# Patient Record
Sex: Female | Born: 1995 | Race: Black or African American | Hispanic: No | Marital: Single | Smoking: Never smoker
Health system: Southern US, Community
[De-identification: ages and names within clinical notes are randomized; demographics above are authoritative.]

## PROBLEM LIST (undated history)

## (undated) DIAGNOSIS — F329 Major depressive disorder, single episode, unspecified: Secondary | ICD-10-CM

## (undated) DIAGNOSIS — F32A Depression, unspecified: Secondary | ICD-10-CM

## (undated) DIAGNOSIS — F419 Anxiety disorder, unspecified: Secondary | ICD-10-CM

## (undated) DIAGNOSIS — F909 Attention-deficit hyperactivity disorder, unspecified type: Secondary | ICD-10-CM

## (undated) HISTORY — DX: Major depressive disorder, single episode, unspecified: F32.9

## (undated) HISTORY — DX: Anxiety disorder, unspecified: F41.9

## (undated) HISTORY — DX: Depression, unspecified: F32.A

## (undated) HISTORY — DX: Attention-deficit hyperactivity disorder, unspecified type: F90.9

---
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
## 2017-02-01 ENCOUNTER — Ambulatory Visit (HOSPITAL_COMMUNITY): Payer: No Typology Code available for payment source | Admitting: Psychiatry

## 2017-02-18 ENCOUNTER — Other Ambulatory Visit (HOSPITAL_COMMUNITY): Payer: Self-pay | Admitting: Psychiatry

## 2017-02-24 ENCOUNTER — Other Ambulatory Visit (HOSPITAL_COMMUNITY): Payer: Self-pay

## 2017-02-24 MED ORDER — ESCITALOPRAM OXALATE 10 MG PO TABS: 10.0000 mg | ORAL_TABLET | Freq: Every day | ORAL | 0 refills | Status: DC

## 2017-02-24 MED ORDER — BUPROPION HCL ER (XL) 150 MG PO TB24: 150.0000 mg | ORAL_TABLET | Freq: Every day | ORAL | 0 refills | Status: DC

## 2017-02-24 NOTE — Telephone Encounter (Signed)
Sent in a one month supply for wellbutrin and Lexapro. Informed pharmacy that patient needs to call our office to make an appointment. Nothing further is needed at this time.

## 2017-03-12 ENCOUNTER — Emergency Department (HOSPITAL_COMMUNITY)
Admission: EM | Admit: 2017-03-12 | Discharge: 2017-03-12 | Disposition: A | Discharge status: Home / Self Care | Payer: BLUE CROSS/BLUE SHIELD | Attending: Emergency Medicine | Admitting: Emergency Medicine

## 2017-03-12 ENCOUNTER — Emergency Department (HOSPITAL_COMMUNITY): Payer: BLUE CROSS/BLUE SHIELD

## 2017-03-12 ENCOUNTER — Encounter (HOSPITAL_COMMUNITY): Payer: Self-pay | Admitting: Emergency Medicine

## 2017-03-12 ENCOUNTER — Other Ambulatory Visit: Payer: Self-pay

## 2017-03-12 DIAGNOSIS — R112 Nausea with vomiting, unspecified: Secondary | ICD-10-CM | POA: Diagnosis not present

## 2017-03-12 DIAGNOSIS — R109 Unspecified abdominal pain: Secondary | ICD-10-CM | POA: Diagnosis present

## 2017-03-12 DIAGNOSIS — R197 Diarrhea, unspecified: Secondary | ICD-10-CM | POA: Diagnosis not present

## 2017-03-12 DIAGNOSIS — K861 Other chronic pancreatitis: Secondary | ICD-10-CM | POA: Insufficient documentation

## 2017-03-12 DIAGNOSIS — Z79899 Other long term (current) drug therapy: Secondary | ICD-10-CM | POA: Insufficient documentation

## 2017-03-12 DIAGNOSIS — K858 Other acute pancreatitis without necrosis or infection: Secondary | ICD-10-CM | POA: Diagnosis not present

## 2017-03-12 LAB — COMPREHENSIVE METABOLIC PANEL
ALBUMIN: 3.6 g/dL (ref 3.5–5.0)
ALK PHOS: 76 U/L (ref 38–126)
ALT: 20 U/L (ref 14–54)
ANION GAP: 7 (ref 5–15)
AST: 23 U/L (ref 15–41)
BILIRUBIN TOTAL: 0.6 mg/dL (ref 0.3–1.2)
BUN: 8 mg/dL (ref 6–20)
CALCIUM: 8.8 mg/dL — AB (ref 8.9–10.3)
CO2: 23 mmol/L (ref 22–32)
Chloride: 107 mmol/L (ref 101–111)
Creatinine, Ser: 0.77 mg/dL (ref 0.44–1.00)
GFR calc non Af Amer: >60 mL/min (ref >60)
GLUCOSE: 94 mg/dL (ref 65–99)
POTASSIUM: 3.9 mmol/L (ref 3.5–5.1)
Sodium: 137 mmol/L (ref 135–145)
TOTAL PROTEIN: 7.6 g/dL (ref 6.5–8.1)

## 2017-03-12 LAB — CBC
HEMATOCRIT: 39.3 % (ref 36.0–46.0)
HEMOGLOBIN: 12.9 g/dL (ref 12.0–15.0)
MCH: 28.7 pg (ref 26.0–34.0)
MCHC: 32.8 g/dL (ref 30.0–36.0)
MCV: 87.5 fL (ref 78.0–100.0)
Platelets: 279 10*3/uL (ref 150–400)
RBC: 4.49 MIL/uL (ref 3.87–5.11)
RDW: 12.8 % (ref 11.5–15.5)
WBC: 8.3 10*3/uL (ref 4.0–10.5)

## 2017-03-12 LAB — I-STAT BETA HCG BLOOD, ED (MC, WL, AP ONLY)

## 2017-03-12 LAB — LIPASE, BLOOD: Lipase: 121 U/L — ABNORMAL HIGH (ref 11–51)

## 2017-03-12 MED ORDER — IOPAMIDOL (ISOVUE-300) INJECTION 61%
INTRAVENOUS | Status: AC
  Filled 2017-03-12: qty 100

## 2017-03-12 MED ORDER — ONDANSETRON HCL 4 MG/2ML IJ SOLN
4.0000 mg | Freq: Once | INTRAMUSCULAR | Status: AC
  Administered 2017-03-12: 4 mg via INTRAVENOUS
  Filled 2017-03-12: qty 2

## 2017-03-12 MED ORDER — PERCOCET 5-325 MG PO TABS: 1.0000 | ORAL_TABLET | Freq: Four times a day (QID) | ORAL | 0 refills | Status: DC | PRN

## 2017-03-12 MED ORDER — MORPHINE SULFATE (PF) 4 MG/ML IV SOLN
4.0000 mg | Freq: Once | INTRAVENOUS | Status: AC
  Administered 2017-03-12: 4 mg via INTRAVENOUS
  Filled 2017-03-12: qty 1

## 2017-03-12 MED ORDER — PROMETHAZINE HCL 25 MG PO TABS: 25.0000 mg | ORAL_TABLET | Freq: Four times a day (QID) | ORAL | 0 refills | Status: DC | PRN

## 2017-03-12 MED ORDER — SODIUM CHLORIDE 0.9 % IV BOLUS (SEPSIS)
1000.0000 mL | Freq: Once | INTRAVENOUS | Status: AC
  Administered 2017-03-12: 1000 mL via INTRAVENOUS

## 2017-03-12 NOTE — Discharge Instructions (Signed)
Return here as needed.  Follow-up with your primary doctor.  Follow a clear liquid diet over the next 48 hours

## 2017-03-12 NOTE — Progress Notes (Signed)
Patient was alert when Arrived. Patient able to talk about her health challenges. Father on-site and very calm. Chaplain provided emotional support and com[passionate presence.

## 2017-03-12 NOTE — ED Notes (Signed)
Pt verbalized understanding of d/c instructions and has no further questions. Pt to follow up with pcp and GI. Removed all belongings from room.

## 2017-03-12 NOTE — ED Notes (Signed)
Pt returned to TRA C from radiology

## 2017-03-12 NOTE — ED Triage Notes (Signed)
Pt. Stated, I started having severe stomach pain this morning and throwing up bile cause nothing on my stomach.

## 2017-03-12 NOTE — ED Notes (Signed)
Pt taken to CT.

## 2017-03-13 NOTE — ED Provider Notes (Signed)
[facility] EMERGENCY [facility]PART[facility] Provider Note   CSN: [id number] Arrival date & time: 03/12/17  1054     History   Chief Complaint Chief Complaint  Patient presents with  . Abdominal Pain  . Pancreatitis    HPI Shannon Holloway is a 21 y.o. female.  HPI Patient presents to the emergency department with nausea vomiting with diarrhea and also abdominal discomfort.  Patient states that she has chronic pancreatitis.  She states that she sees a GI doctor in [place] but has no one here to follow-up with.  The patient states that nothing seems to make the condition better.  She states palpation makes the pain worse.  Patient states she did not take any medications prior to arrival patient states that she still has nausea but has not vomited in several hours.  The patient denies chest pain, shortness of breath, headache,blurred vision, neck pain, fever, cough, weakness, numbness, dizziness, anorexia, edema, rash, back pain, dysuria, hematemesis, bloody stool, near syncope, or syncope. Past Medical History:  Diagnosis Date  . ADHD (attention deficit hyperactivity disorder)   . Anxiety   . Depression     Patient Active Problem List   Diagnosis Date Noted  . History of panic attacks 05/19/2016  . Major depressive disorder 05/19/2016    Past Surgical History:  Procedure Laterality Date  . CHOLECYSTECTOMY  2012  . HERNIA REPAIR  2001    OB History    No data available       Home Medications    Prior to Admission medications   Medication Sig Start Date End Date Taking? Authorizing Provider  buPROPion (WELLBUTRIN XL) 150 MG 24 hr tablet Take 1 tablet (150 mg total) by mouth daily. 02/24/17  Yes Thresa RossAkhtar, Nadeem, MD  escitalopram (LEXAPRO) 10 MG tablet Take 1 tablet (10 mg total) by mouth daily. Take one a day 02/24/17  Yes Thresa RossAkhtar, Nadeem, MD  JUNEL 1/20 1-20 MG-MCG tablet Take 1 tablet by mouth daily. 08/08/16  Yes [provider]  VYVANSE 20 MG  capsule Take 1 capsule (20 mg total) by mouth every morning. 11/23/16  Yes Thresa RossAkhtar, Nadeem, MD  escitalopram (LEXAPRO) 10 MG tablet TAKE 1 TABLET BY MOUTH ONCE DAILY Patient not taking: Reported on 03/12/2017 02/24/17   Thresa RossAkhtar, Nadeem, MD  PERCOCET 5-325 MG tablet Take 1 tablet by mouth every 6 (six) hours as needed for severe pain. 03/12/17   Yehudit Fulginiti, Cristal Deerhristopher, PA-C  promethazine (PHENERGAN) 25 MG tablet Take 1 tablet (25 mg total) by mouth every 6 (six) hours as needed for nausea or vomiting. 03/12/17   Hermila Millis, Cristal Deerhristopher, PA-C  VYVANSE 20 MG capsule Take 1 capsule (20 mg total) by mouth every morning. Patient not taking: Reported on 03/12/2017 11/23/16   Thresa RossAkhtar, Nadeem, MD    Family History Family History  Problem Relation Age of Onset  . Anxiety disorder Mother   . Anxiety disorder Maternal Grandmother     Social History Social History   Tobacco Use  . Smoking status: Never Smoker  . Smokeless tobacco: Never Used  Substance Use Topics  . Alcohol use: No  . Drug use: No     Allergies   Patient has no known allergies.   Review of Systems Review of Systems All other systems negative except as documented in the HPI. All pertinent positives and negatives as reviewed in the HPI.  Physical Exam Updated Vital Signs BP 128/83   Pulse 68   Temp 98.3 F (36.8 C) (Oral)   Resp  16   Ht 5\' 7"  (1.702 m)   Wt 73 kg (161 lb)   LMP 02/20/2017   SpO2 100%   BMI 25.22 kg/m   Physical Exam  Constitutional: She is oriented to person, place, and time. She appears well-developed and well-nourished. No distress.  HENT:  Head: Normocephalic and atraumatic.  Mouth/Throat: Oropharynx is clear and moist.  Eyes: Pupils are equal, round, and reactive to light.  Neck: Normal range of motion. Neck supple.  Cardiovascular: Normal rate, regular rhythm and normal heart sounds. Exam reveals no gallop and no friction rub.  No murmur heard. Pulmonary/Chest: Effort normal and breath sounds  normal. No respiratory distress. She has no wheezes.  Abdominal: Soft. Bowel sounds are normal. She exhibits no distension. There is generalized tenderness. There is no rigidity, no rebound, no guarding and no tenderness at McBurney's point. No hernia.  Neurological: She is alert and oriented to person, place, and time. She exhibits normal muscle tone. Coordination normal.  Skin: Skin is warm and dry. Capillary refill takes less than 2 seconds. No rash noted. No erythema.  Psychiatric: She has a normal mood and affect. Her behavior is normal.  Nursing note and vitals reviewed.    ED Treatments / Results  Labs (all labs ordered are listed, but only abnormal results are displayed) Labs Reviewed  LIPASE, BLOOD - Abnormal; Notable for the following components:      Result Value   Lipase 121 (*)    All other components within normal limits  COMPREHENSIVE METABOLIC PANEL - Abnormal; Notable for the following components:   Calcium 8.8 (*)    All other components within normal limits  CBC  I-STAT BETA HCG BLOOD, ED ([facility], [facility], [facility] ONLY)    EKG  EKG Interpretation None       Radiology Ct Abdomen Pelvis Wo Contrast  Result Date: 03/12/2017 CLINICAL DATA:  Abdominal distention.  Chronic pancreatitis EXAM: CT ABDOMEN AND PELVIS WITHOUT CONTRAST TECHNIQUE: Multidetector CT imaging of the abdomen and pelvis was performed following the standard protocol without IV contrast. COMPARISON:  None. FINDINGS: Lower chest: Lung bases are clear. No effusions. Heart is normal size. Hepatobiliary: No focal liver abnormality is seen. Status post cholecystectomy. No biliary dilatation. Pancreas: Marked enlargement of the pancreatic head with indistinctness of the margins and surrounding stranding. Findings presumably represent acute pancreatitis. Recommend follow-up after resolution of acute symptoms to exclude pancreatic head mass. Spleen: No focal abnormality.  Normal size. Adrenals/Urinary Tract: No adrenal  abnormality. No focal renal abnormality. No stones or hydronephrosis. Urinary bladder is unremarkable. Stomach/Bowel: Stomach, large and small bowel grossly unremarkable. Appendix not visualized. No inflammatory process in the right lower quadrant. Vascular/Lymphatic: No evidence of aneurysm or adenopathy. Reproductive: Uterus and adnexa unremarkable.  No mass. Other: No free fluid or free air. Musculoskeletal: No acute bony abnormality. IMPRESSION: Marked enlargement and indistinctness of the pancreatic head with surrounding inflammation/stranding most likely or reflecting acute pancreatitis. Recommend follow-up CT with IV contrast after acute symptoms resolve to exclude pancreatic head mass. Electronically Signed   By: Charlett NoseKevin  Dover M.D.   On: 03/12/2017 16:09    Procedures Procedures (including critical care time)  Medications Ordered in ED Medications  morphine 4 MG/ML injection 4 mg (4 mg Intravenous Given 03/12/17 1427)  sodium chloride 0.9 % bolus 1,000 mL (0 mLs Intravenous Stopped 03/12/17 1548)  ondansetron (ZOFRAN) injection 4 mg (4 mg Intravenous Given 03/12/17 1427)     Initial Impression / Assessment and Plan / ED Course  I have reviewed the triage vital signs and the nursing notes.  Pertinent labs & imaging results that were available during my care of the patient were reviewed by me and considered in my medical decision making (see chart for details).     Patient has a pancreatitis which is chronic for her.  She does have some CT scan findings that showed the inflammation of the pancreas.  Patient is advised to follow a clear liquid diet.  Told return here for any worsening in her condition will have her follow-up with GI.  Patient is feeling better at this time following IV fluids and pain medications.  Patient has had no vomiting here in the emergency department  Final Clinical Impressions(s) / ED Diagnoses   Final diagnoses:  Other acute pancreatitis without infection or  necrosis    ED Discharge Orders        Ordered    PERCOCET 5-325 MG tablet  Every 6 hours PRN     03/12/17 1641    promethazine (PHENERGAN) 25 MG tablet  Every 6 hours PRN     03/12/17 1641       Charlestine Night, PA-C 03/17/17 0034    Charlestine Night, PA-C 03/17/17 0034    Tilden Fossa, MD 03/23/17 1428

## 2017-05-06 ENCOUNTER — Other Ambulatory Visit (HOSPITAL_COMMUNITY): Payer: Self-pay | Admitting: Psychiatry

## 2017-05-07 ENCOUNTER — Other Ambulatory Visit (HOSPITAL_COMMUNITY): Payer: Self-pay | Admitting: Psychiatry

## 2017-06-18 ENCOUNTER — Other Ambulatory Visit (HOSPITAL_COMMUNITY): Payer: Self-pay | Admitting: Psychiatry

## 2017-12-17 IMAGING — CR DG CHEST 2V
2 series · 2 of 2 positions shown · non-contrast
Comparison: None.

CLINICAL DATA: Chronic cough with fever

EXAM:
CHEST  2 VIEW

[w chest pa]
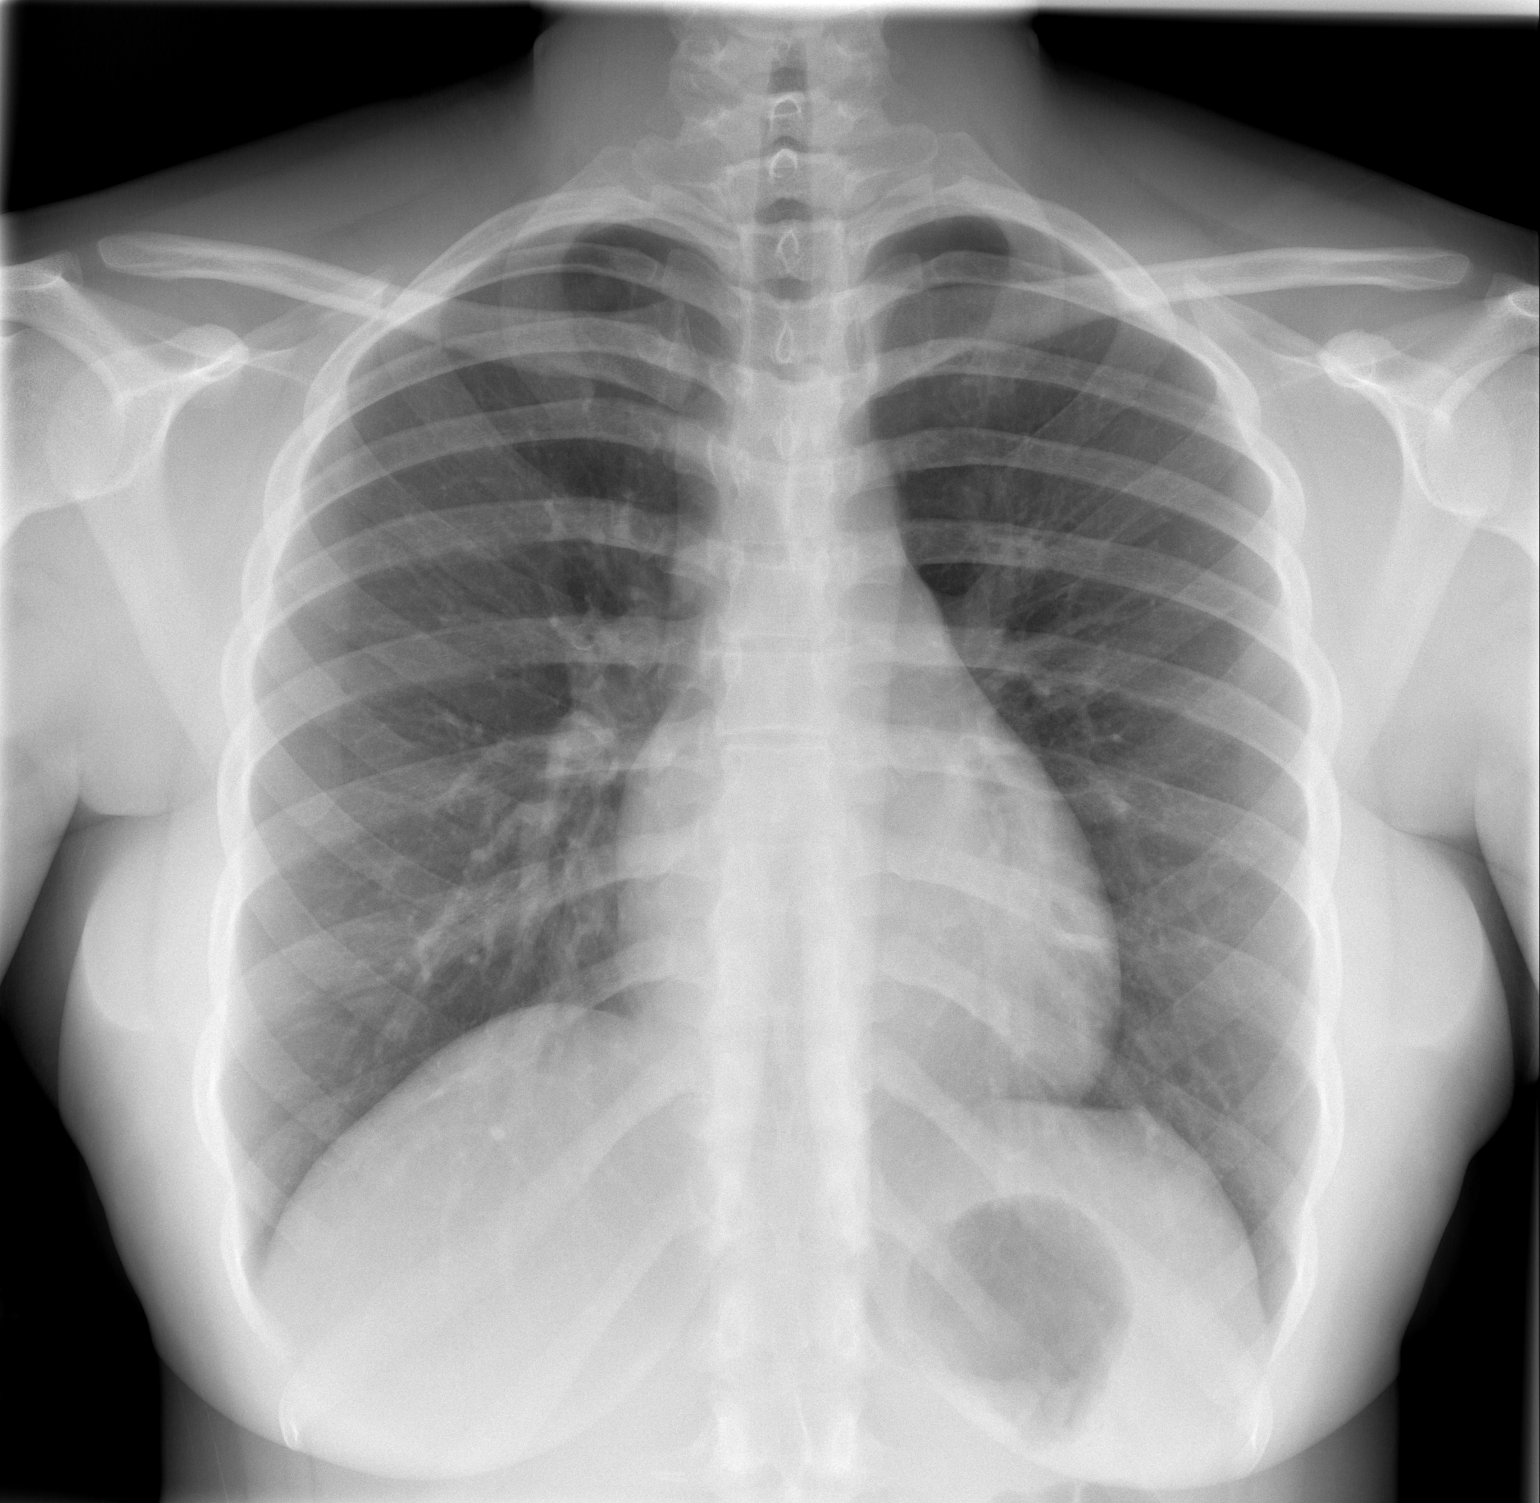

[w chest lat]
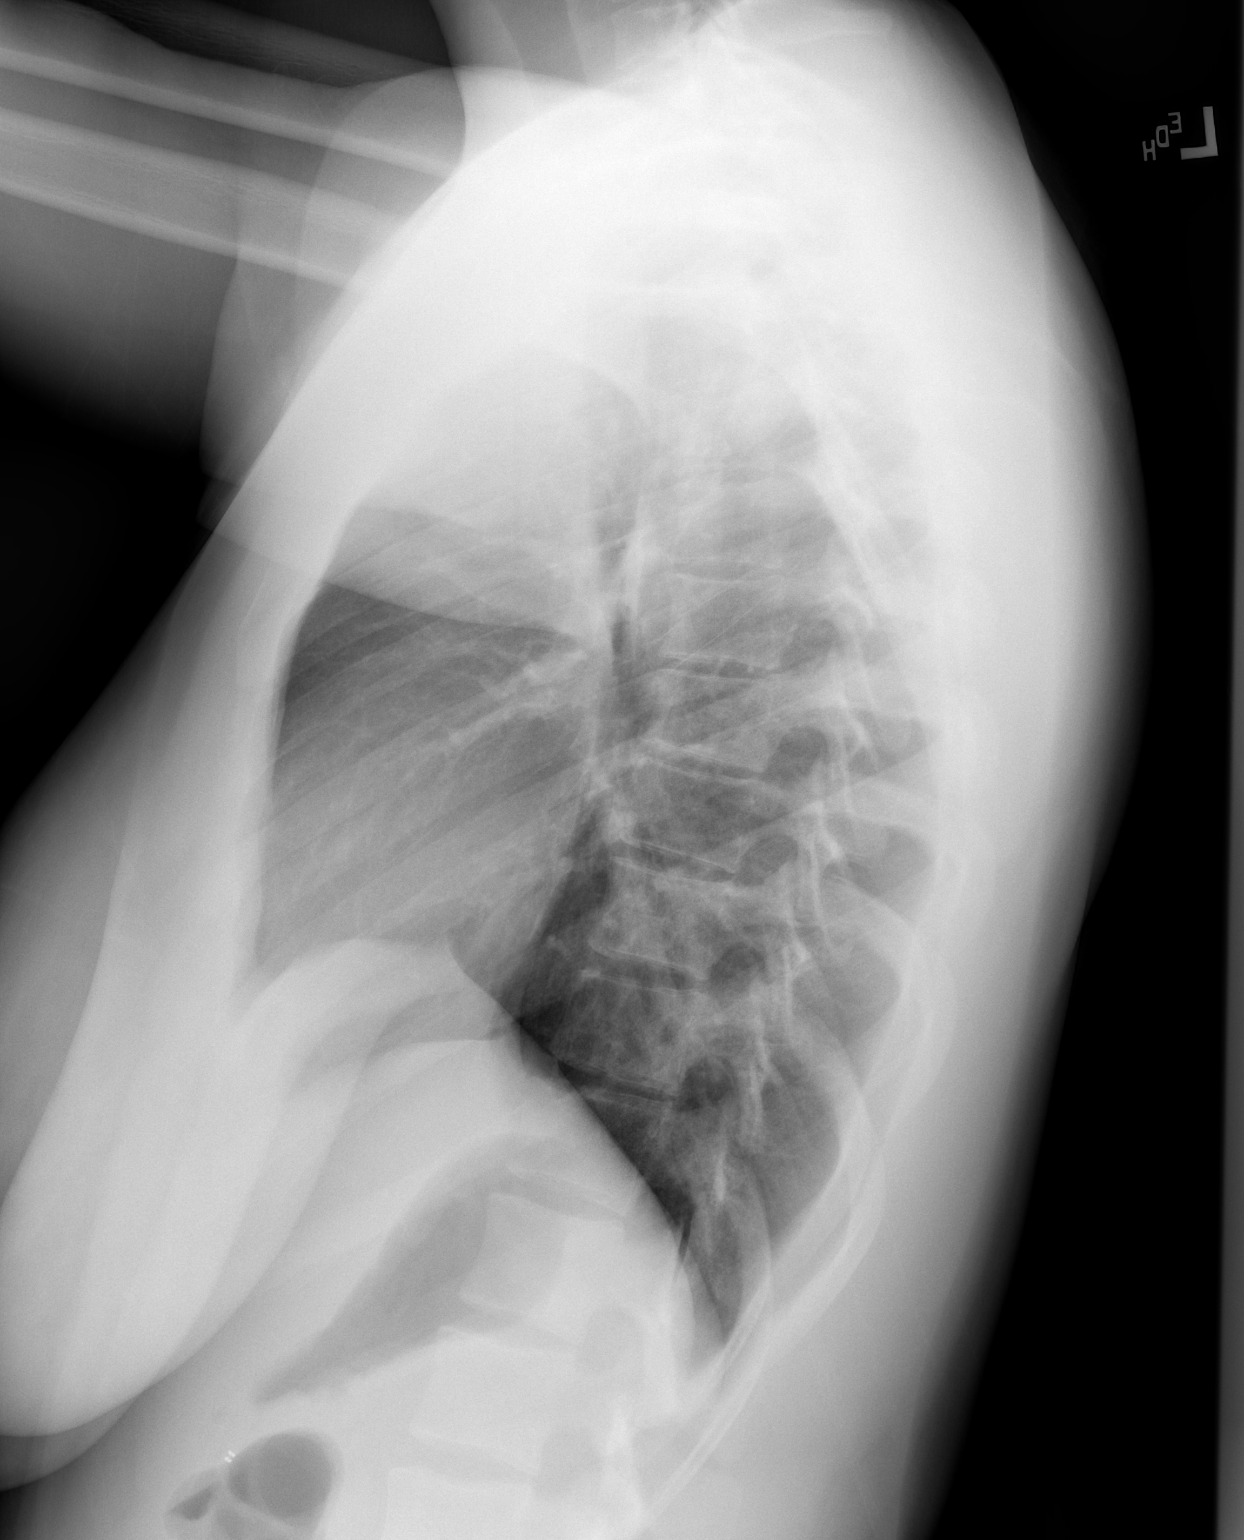

[2 of 2 positions shown; findings below may reference images not displayed]

FINDINGS: Normal heart size. Normal mediastinal contour. No pneumothorax. No
pleural effusion. Lungs appear clear, with no acute consolidative
airspace disease and no pulmonary edema. Visualized osseous
structures appear intact.
IMPRESSION: No active cardiopulmonary disease.

## 2018-03-05 IMAGING — CT CT ABD-PELV W/O CM
2 of 4 series · 16 of 46 positions shown, 18 images · non-contrast
Comparison: None.

CLINICAL DATA: Abdominal distention.  Chronic pancreatitis

EXAM:
CT ABDOMEN AND PELVIS WITHOUT CONTRAST
TECHNIQUE: Multidetector CT imaging of the abdomen and pelvis was performed
following the standard protocol without IV contrast.

[Series 3: abd/ pelvis 5.0 i30f 2 · axial · 0.88mm/px · z∈[+729,+1164]mm · 13 of 95 slices shown, 15 images]
[im 4/95  soft-tissue]
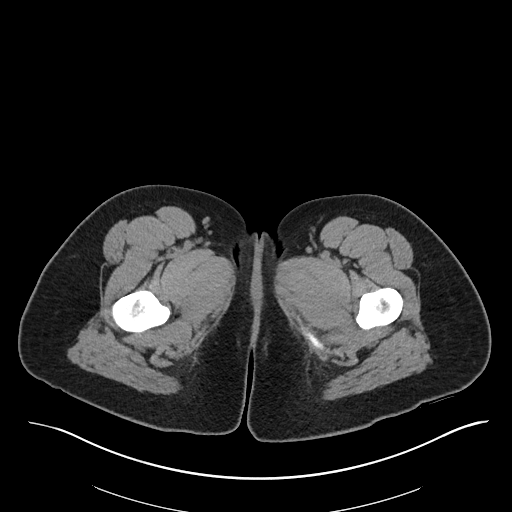
[im 4/95  bone]
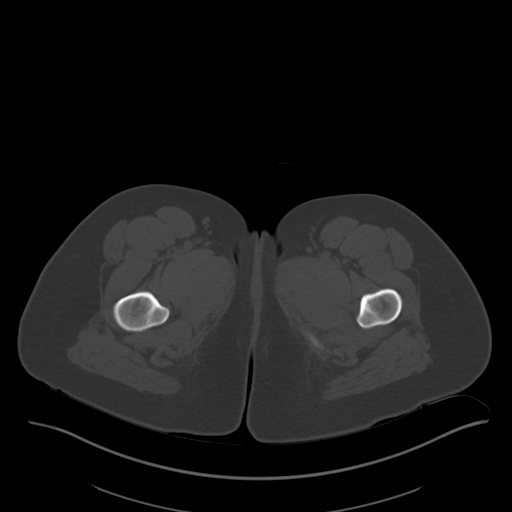
[im 12/95  soft-tissue]
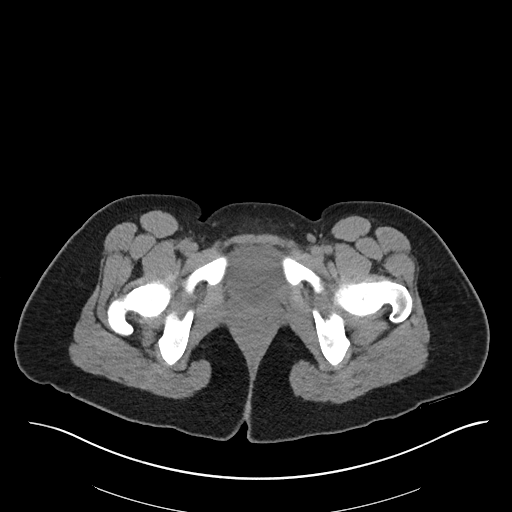
[im 20/95  soft-tissue]
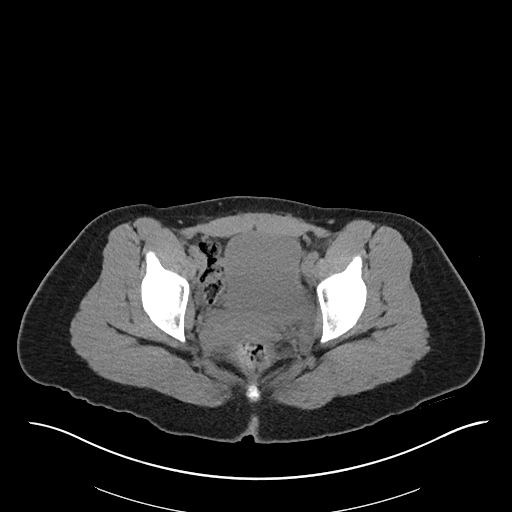
[im 28/95  soft-tissue]
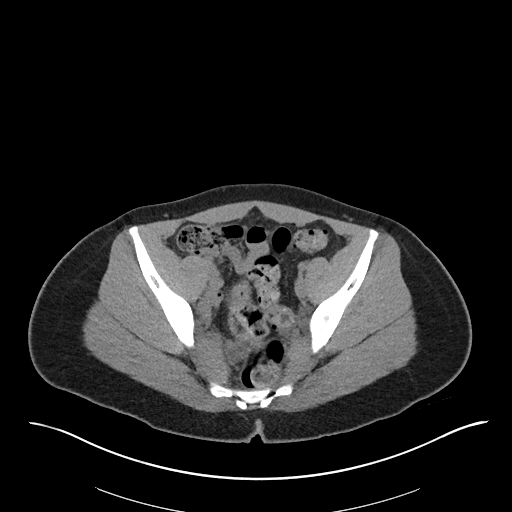
[im 32/95  soft-tissue]
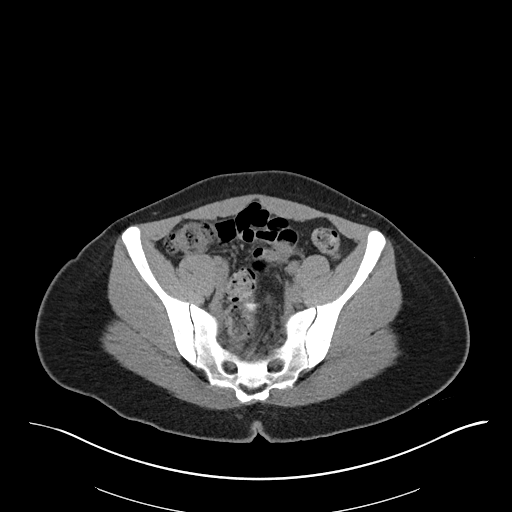
[im 40/95  soft-tissue]
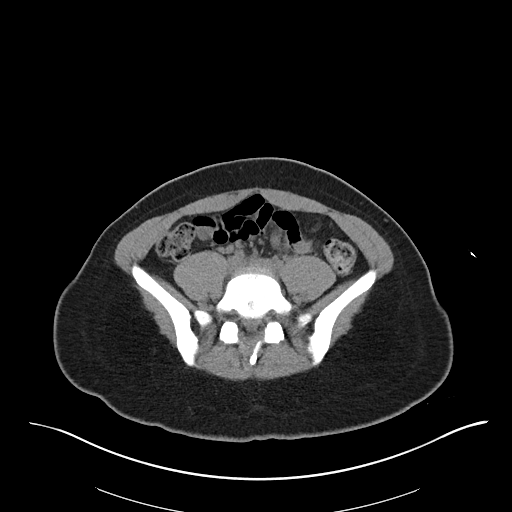
[im 48/95  soft-tissue]
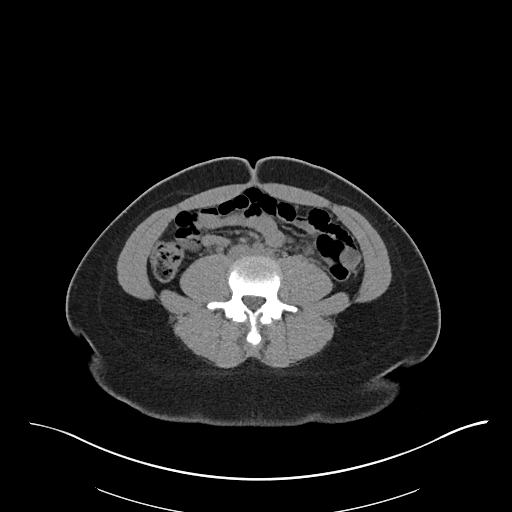
[im 55/95  soft-tissue]
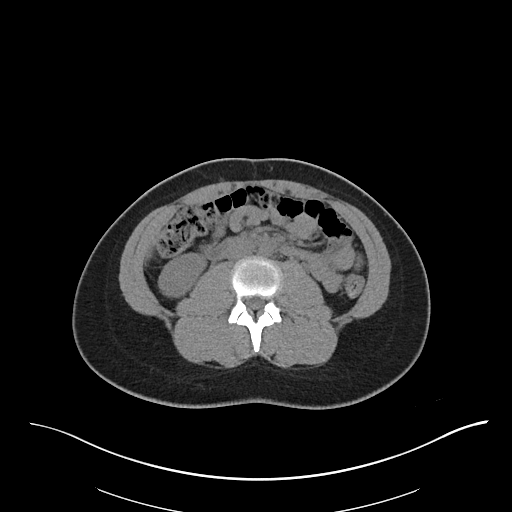
[im 63/95  soft-tissue]
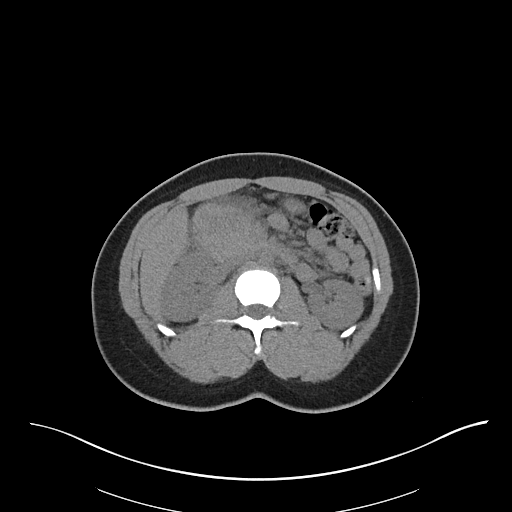
[im 63/95  bone]
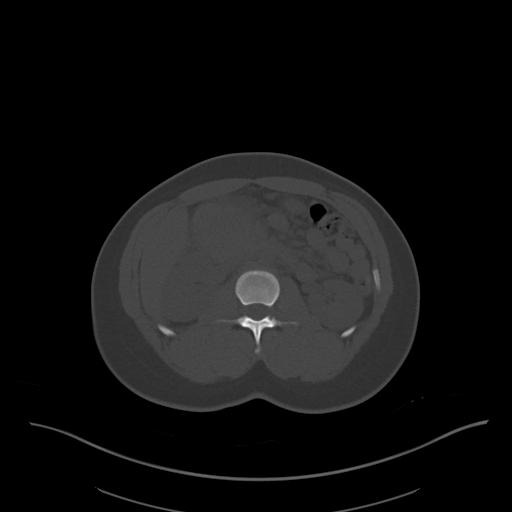
[im 67/95  soft-tissue]
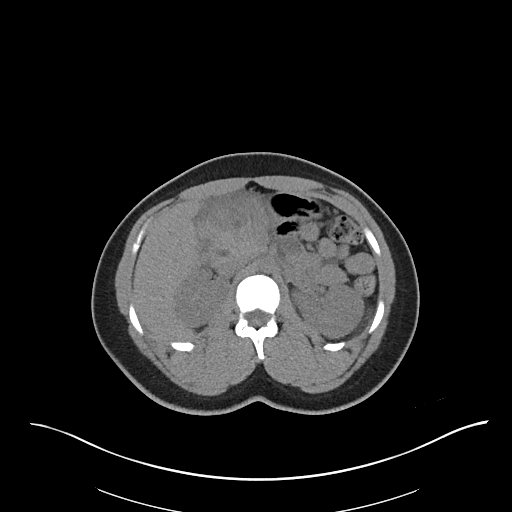
[im 75/95  soft-tissue]
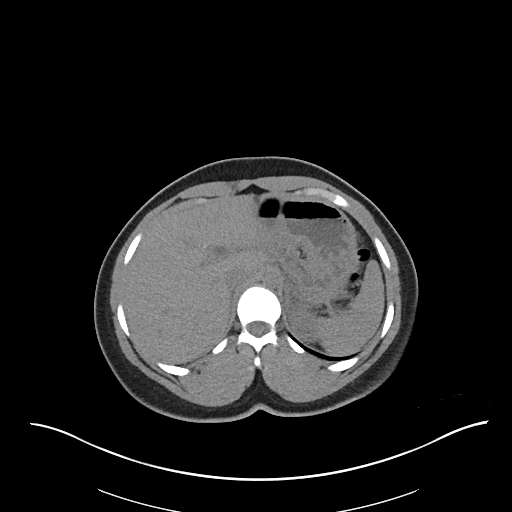
[im 83/95  soft-tissue]
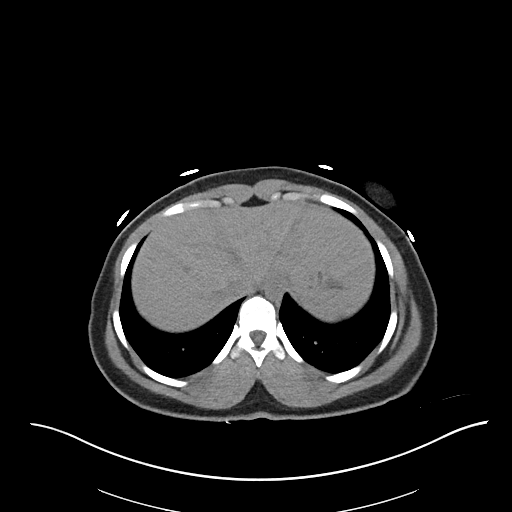
[im 91/95  soft-tissue]
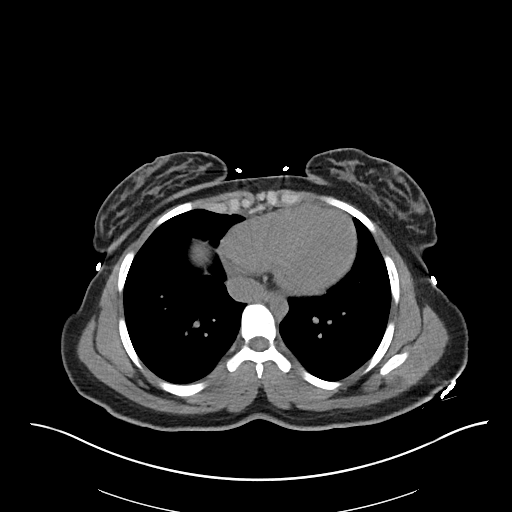

[Series 6: cor st · coronal · 0.83mm/px · 3 of 100 slices shown]
[im 34/100  soft-tissue]
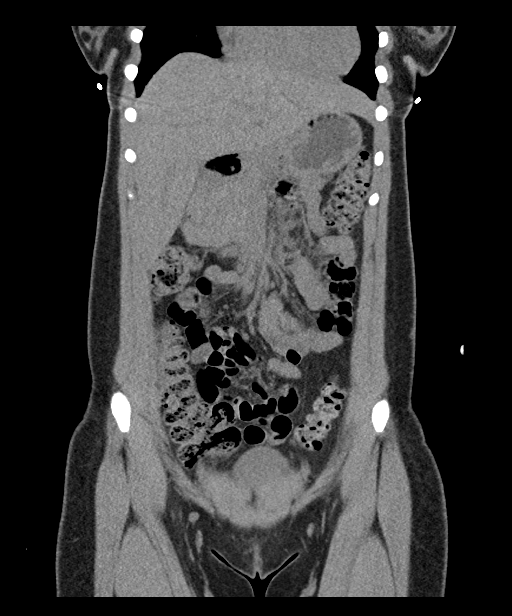
[im 45/100  soft-tissue]
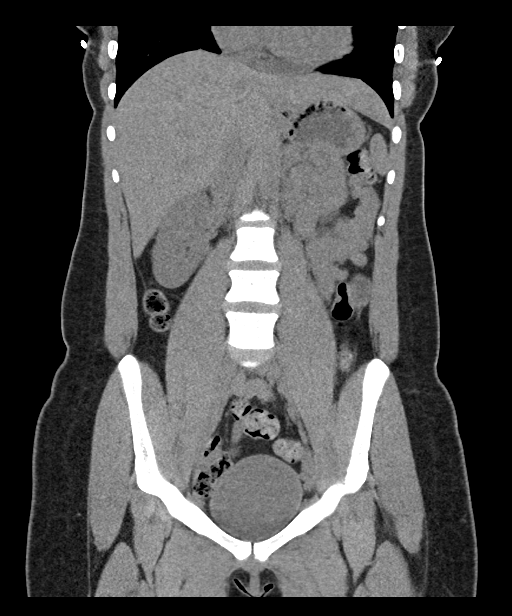
[im 56/100  soft-tissue]
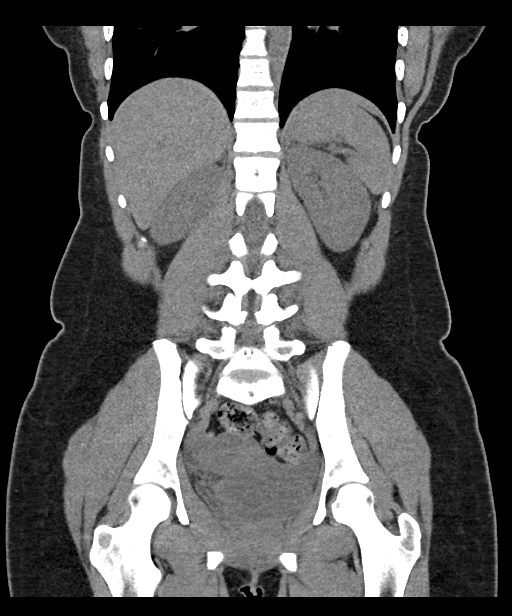

[16 of 46 positions shown; findings below may reference images not displayed]

FINDINGS: Lower chest: Lung bases are clear. No effusions. Heart is normal
size.

Hepatobiliary: No focal liver abnormality is seen. Status post
cholecystectomy. No biliary dilatation.

Pancreas: Marked enlargement of the pancreatic head with
indistinctness of the margins and surrounding stranding. Findings
presumably represent acute pancreatitis. Recommend follow-up after
resolution of acute symptoms to exclude pancreatic head mass.

Spleen: No focal abnormality.  Normal size.

Adrenals/Urinary Tract: No adrenal abnormality. No focal renal
abnormality. No stones or hydronephrosis. Urinary bladder is
unremarkable.

Stomach/Bowel: Stomach, large and small bowel grossly unremarkable.
Appendix not visualized. No inflammatory process in the right lower
quadrant.

Vascular/Lymphatic: No evidence of aneurysm or adenopathy.

Reproductive: Uterus and adnexa unremarkable.  No mass.

Other: No free fluid or free air.

Musculoskeletal: No acute bony abnormality.
IMPRESSION: Marked enlargement and indistinctness of the pancreatic head with
surrounding inflammation/stranding most likely or reflecting acute
pancreatitis. Recommend follow-up CT with IV contrast after acute
symptoms resolve to exclude pancreatic head mass.

## 2018-05-09 ENCOUNTER — Emergency Department (HOSPITAL_COMMUNITY): Payer: Self-pay

## 2018-05-09 ENCOUNTER — Encounter (HOSPITAL_COMMUNITY): Payer: Self-pay | Admitting: Emergency Medicine

## 2018-05-09 ENCOUNTER — Other Ambulatory Visit: Payer: Self-pay

## 2018-05-09 DIAGNOSIS — Z5321 Procedure and treatment not carried out due to patient leaving prior to being seen by health care provider: Secondary | ICD-10-CM | POA: Insufficient documentation

## 2018-05-09 DIAGNOSIS — R079 Chest pain, unspecified: Secondary | ICD-10-CM | POA: Insufficient documentation

## 2018-05-09 LAB — BASIC METABOLIC PANEL
ANION GAP: 7 (ref 5–15)
BUN: 8 mg/dL (ref 6–20)
CALCIUM: 8.7 mg/dL — AB (ref 8.9–10.3)
CO2: 24 mmol/L (ref 22–32)
Chloride: 106 mmol/L (ref 98–111)
Creatinine, Ser: 0.92 mg/dL (ref 0.44–1.00)
GFR calc Af Amer: >60 mL/min (ref >60)
GLUCOSE: 116 mg/dL — AB (ref 70–99)
POTASSIUM: 3.9 mmol/L (ref 3.5–5.1)
SODIUM: 137 mmol/L (ref 135–145)

## 2018-05-09 LAB — CBC
HCT: 39.9 % (ref 36.0–46.0)
HEMOGLOBIN: 12.4 g/dL (ref 12.0–15.0)
MCH: 29.1 pg (ref 26.0–34.0)
MCHC: 31.1 g/dL (ref 30.0–36.0)
MCV: 93.7 fL (ref 80.0–100.0)
PLATELETS: 290 10*3/uL (ref 150–400)
RBC: 4.26 MIL/uL (ref 3.87–5.11)
RDW: 12.2 % (ref 11.5–15.5)
WBC: 8 10*3/uL (ref 4.0–10.5)
nRBC: 0 % (ref 0.0–0.2)

## 2018-05-09 LAB — I-STAT BETA HCG BLOOD, ED (NOT ORDERABLE): I-stat hCG, quantitative: <5 m[IU]/mL (ref <5)

## 2018-05-09 LAB — POCT I-STAT TROPONIN I: TROPONIN I, POC: 0.01 ng/mL (ref 0.00–0.08)

## 2018-05-09 MED ORDER — SODIUM CHLORIDE 0.9% FLUSH: 3.0000 mL | Freq: Once | INTRAVENOUS | Status: DC

## 2018-05-09 NOTE — ED Triage Notes (Signed)
Patient c/o intermittent sharp right side chest pain radiating to left chest with SOB x3 hours. Reports productive cough x3 weeks. States coughing increases chest pain.

## 2018-05-10 ENCOUNTER — Emergency Department (HOSPITAL_COMMUNITY)
Admission: EM | Admit: 2018-05-10 | Discharge: 2018-05-10 | Disposition: A | Discharge status: Home / Self Care | Payer: Self-pay | Attending: Emergency Medicine | Admitting: Emergency Medicine

## 2018-05-10 NOTE — ED Notes (Signed)
Pt called to be roomed with no response.  RN notified. 

## 2018-09-11 ENCOUNTER — Ambulatory Visit (HOSPITAL_COMMUNITY)
Admission: EM | Admit: 2018-09-11 | Discharge: 2018-09-11 | Disposition: A | Discharge status: Home / Self Care | Payer: BC Managed Care – PPO | Attending: Family Medicine | Admitting: Family Medicine

## 2018-09-11 ENCOUNTER — Other Ambulatory Visit: Payer: Self-pay

## 2018-09-11 ENCOUNTER — Encounter (HOSPITAL_COMMUNITY): Payer: Self-pay | Admitting: *Deleted

## 2018-09-11 DIAGNOSIS — N760 Acute vaginitis: Secondary | ICD-10-CM | POA: Diagnosis present

## 2018-09-11 DIAGNOSIS — B9689 Other specified bacterial agents as the cause of diseases classified elsewhere: Secondary | ICD-10-CM | POA: Diagnosis not present

## 2018-09-11 LAB — POCT URINALYSIS DIP (DEVICE)
Bilirubin Urine: NEGATIVE
Glucose, UA: NEGATIVE mg/dL
Ketones, ur: NEGATIVE mg/dL
Leukocytes,Ua: NEGATIVE
Nitrite: NEGATIVE
Protein, ur: NEGATIVE mg/dL
Specific Gravity, Urine: 1.025 (ref 1.005–1.030)
Urobilinogen, UA: 0.2 mg/dL (ref 0.0–1.0)
pH: 6 (ref 5.0–8.0)

## 2018-09-11 LAB — POCT PREGNANCY, URINE: Preg Test, Ur: NEGATIVE

## 2018-09-11 MED ORDER — FLUCONAZOLE 150 MG PO TABS: 150.0000 mg | ORAL_TABLET | Freq: Once | ORAL | 0 refills | Status: AC

## 2018-09-11 NOTE — Discharge Instructions (Signed)
Please take 1 tablet of Diflucan to treat for yeast as cause of itching.  May repeat tablet in a few days if still having symptoms.  We are testing you for Gonorrhea, Chlamydia, Trichomonas, Yeast and Bacterial Vaginosis. We will call you if anything is positive and let you know if you require any further treatment. Please inform partners of any positive results.   Please return if symptoms not improving with treatment, development of fever, nausea, vomiting, abdominal pain.

## 2018-09-11 NOTE — ED Provider Notes (Signed)
[facility]    CSN: [id number] Arrival date & time: 09/11/18  [id number]     History   Chief Complaint Chief Complaint  Patient presents with  . Vaginal Itching    HPI Shannon Holloway is a 23 y.o. female history of previous cholecystectomy presenting today for evaluation of vaginal itching.  Patient symptoms began approximately 1 week ago and have persisted.  She notes that she had an episode of vaginal bleeding on 09/05/18, but is currently on Depo-Provera and has not had a menstrual cycle in the past year.  Denies any late or missed doses.  Main complaint is irritation.  Her symptoms feel very similar to when she had chlamydia 2 years ago.  Denies any known exposures recently, but is still sexually active.  Denies history of yeast and BV.  Denies fever, nausea vomiting or abdominal pain.  Denies pelvic pain.  Denies any rashes or lesions.  HPI  Past Medical History:  Diagnosis Date  . ADHD (attention deficit hyperactivity disorder)   . Anxiety   . Depression     Patient Active Problem List   Diagnosis Date Noted  . History of panic attacks 05/19/2016  . Major depressive disorder 05/19/2016    Past Surgical History:  Procedure Laterality Date  . CHOLECYSTECTOMY  2012  . HERNIA REPAIR  2001    OB History   No obstetric history on file.      Home Medications    Prior to Admission medications   Medication Sig Start Date End Date Taking? Authorizing Provider  medroxyPROGESTERone Acetate (DEPO-PROVERA IM) Inject into the muscle.   Yes [provider]  buPROPion (WELLBUTRIN XL) 150 MG 24 hr tablet Take 1 tablet (150 mg total) by mouth daily. 02/24/17   Thresa RossAkhtar, Nadeem, MD  escitalopram (LEXAPRO) 10 MG tablet Take 1 tablet (10 mg total) by mouth daily. Take one a day 02/24/17   Thresa RossAkhtar, Nadeem, MD  fluconazole (DIFLUCAN) 150 MG tablet Take 1 tablet (150 mg total) by mouth once for 1 dose. 09/11/18 09/11/18  ,  C, PA-C  JUNEL 1/20 1-20 MG-MCG  tablet Take 1 tablet by mouth daily. 08/08/16   [provider]  PERCOCET 5-325 MG tablet Take 1 tablet by mouth every 6 (six) hours as needed for severe pain. 03/12/17   Lawyer, Cristal Deerhristopher, PA-C  promethazine (PHENERGAN) 25 MG tablet Take 1 tablet (25 mg total) by mouth every 6 (six) hours as needed for nausea or vomiting. 03/12/17   Lawyer, Cristal Deerhristopher, PA-C  VYVANSE 20 MG capsule Take 1 capsule (20 mg total) by mouth every morning. 11/23/16   Thresa RossAkhtar, Nadeem, MD    Family History Family History  Problem Relation Age of Onset  . Anxiety disorder Mother   . Anxiety disorder Maternal Grandmother     Social History Social History   Tobacco Use  . Smoking status: Never Smoker  . Smokeless tobacco: Never Used  Substance Use Topics  . Alcohol use: No  . Drug use: Never     Allergies   Patient has no known allergies.   Review of Systems Review of Systems  Constitutional: Negative for fever.  Respiratory: Negative for shortness of breath.   Cardiovascular: Negative for chest pain.  Gastrointestinal: Negative for abdominal pain, diarrhea, nausea and vomiting.  Genitourinary: Negative for dysuria, flank pain, genital sores, hematuria, menstrual problem, vaginal bleeding, vaginal discharge and vaginal pain.       Irritation  Musculoskeletal: Negative for back pain.  Skin: Negative for rash.  Neurological: Negative for dizziness, light-headedness and headaches.     Physical Exam Triage Vital Signs ED Triage Vitals [09/11/18 0921]  Enc Vitals Group     BP 134/83     Pulse Rate 91     Resp 16     Temp 98.8 F (37.1 C)     Temp Source Oral     SpO2 100 %     Weight      Height      Head Circumference      Peak Flow      Pain Score 0     Pain Loc      Pain Edu?      Excl. in GC?    No data found.  Updated Vital Signs BP 134/83   Pulse 91   Temp 98.8 F (37.1 C) (Oral)   Resp 16   SpO2 100%   Visual Acuity Right Eye Distance:   Left Eye Distance:    Bilateral Distance:    Right Eye Near:   Left Eye Near:    Bilateral Near:     Physical Exam Vitals signs and nursing note reviewed.  Constitutional:      Appearance: She is well-developed.     Comments: No acute distress Sitting comfortably on exam table  HENT:     Head: Normocephalic and atraumatic.     Nose: Nose normal.  Eyes:     Conjunctiva/sclera: Conjunctivae normal.  Neck:     Musculoskeletal: Neck supple.  Cardiovascular:     Rate and Rhythm: Normal rate.  Pulmonary:     Effort: Pulmonary effort is normal. No respiratory distress.  Abdominal:     General: There is no distension.     Comments: Soft, nondistended, nontender to light and deep palpation throughout abdomen  Musculoskeletal: Normal range of motion.  Skin:    General: Skin is warm and dry.  Neurological:     Mental Status: She is alert and oriented to person, place, and time.      UC Treatments / Results  Labs (all labs ordered are listed, but only abnormal results are displayed) Labs Reviewed  POC URINE PREG, ED  CERVICOVAGINAL ANCILLARY ONLY    EKG None  Radiology No results found.  Procedures Procedures (including critical care time)  Medications Ordered in UC Medications - No data to display  Initial Impression / Assessment and Plan / UC Course  I have reviewed the triage vital signs and the nursing notes.  Pertinent labs & imaging results that were available during my care of the patient were reviewed by me and considered in my medical decision making (see chart for details).     Pregnancy test negative, UA unremarkable for signs of UTI. 1 week of vaginal irritation, likely vulvovaginitis, will empirically treat for yeast today based off symptoms and recent menstrual cycle.  Swab obtained that we will send off to check for STDs as well including gonorrhea, chlamydia and trichomonas.  Will call with results and provide treatment if needed. Discussed strict return precautions.  Patient verbalized understanding and is agreeable with plan.  Final Clinical Impressions(s) / UC Diagnoses   Final diagnoses:  Vaginitis and vulvovaginitis     Discharge Instructions     Please take 1 tablet of Diflucan to treat for yeast as cause of itching.  May repeat tablet in a few days if still having symptoms.  We are testing you for Gonorrhea, Chlamydia, Trichomonas, Yeast and Bacterial Vaginosis. We will call you if  anything is positive and let you know if you require any further treatment. Please inform partners of any positive results.   Please return if symptoms not improving with treatment, development of fever, nausea, vomiting, abdominal pain.     ED Prescriptions    Medication Sig Dispense Auth. Provider   fluconazole (DIFLUCAN) 150 MG tablet Take 1 tablet (150 mg total) by mouth once for 1 dose. 2 tablet ,  C, PA-C     Controlled Substance Prescriptions [place] Controlled Substance Registry consulted? Not Applicable   Janith Lima, [place] 09/11/18 [phone]

## 2018-09-11 NOTE — ED Triage Notes (Signed)
Pt reports vaginal irritation and pruritis since approx 6/14.  States had vaginal bleeding starting on 6/12 despite being on Depo shot (no missed or late doses) and not having menses x > 1 yr.  Denies pain.

## 2018-09-12 LAB — CERVICOVAGINAL ANCILLARY ONLY
Bacterial vaginitis: POSITIVE — AB
Candida vaginitis: NEGATIVE
Chlamydia: NEGATIVE
Neisseria Gonorrhea: NEGATIVE
Trichomonas: NEGATIVE

## 2018-09-14 ENCOUNTER — Telehealth (HOSPITAL_COMMUNITY): Payer: Self-pay | Admitting: Emergency Medicine

## 2018-09-14 MED ORDER — METRONIDAZOLE 500 MG PO TABS: 500.0000 mg | ORAL_TABLET | Freq: Two times a day (BID) | ORAL | 0 refills | Status: AC

## 2018-09-14 NOTE — Telephone Encounter (Signed)
Bacterial vaginosis is positive. This was not treated at the urgent care visit.  Flagyl 500 mg BID x 7 days #14 no refills sent to patients pharmacy of choice.    Patient contacted and made aware of all results, all questions answered.  

## 2018-11-24 ENCOUNTER — Other Ambulatory Visit: Payer: Self-pay

## 2018-11-24 ENCOUNTER — Encounter (HOSPITAL_COMMUNITY): Payer: Self-pay | Admitting: Emergency Medicine

## 2018-11-24 ENCOUNTER — Ambulatory Visit (HOSPITAL_COMMUNITY)
Admission: EM | Admit: 2018-11-24 | Discharge: 2018-11-24 | Disposition: A | Discharge status: Home / Self Care | Payer: BC Managed Care – PPO | Attending: Family Medicine | Admitting: Family Medicine

## 2018-11-24 DIAGNOSIS — Z3202 Encounter for pregnancy test, result negative: Secondary | ICD-10-CM | POA: Diagnosis not present

## 2018-11-24 DIAGNOSIS — R102 Pelvic and perineal pain: Secondary | ICD-10-CM | POA: Diagnosis present

## 2018-11-24 LAB — POCT URINALYSIS DIP (DEVICE)
Bilirubin Urine: NEGATIVE
Glucose, UA: NEGATIVE mg/dL
Hgb urine dipstick: NEGATIVE
Ketones, ur: NEGATIVE mg/dL
Leukocytes,Ua: NEGATIVE
Nitrite: NEGATIVE
Protein, ur: NEGATIVE mg/dL
Specific Gravity, Urine: >=1.03 (ref 1.005–1.030)
Urobilinogen, UA: 0.2 mg/dL (ref 0.0–1.0)
pH: 5.5 (ref 5.0–8.0)

## 2018-11-24 LAB — POCT PREGNANCY, URINE: Preg Test, Ur: NEGATIVE

## 2018-11-24 NOTE — ED Provider Notes (Signed)
Shannon Holloway    CSN: [id number] Arrival date & time: 11/24/18  1624      History   Chief Complaint Chief Complaint  Patient presents with  . Abdominal Pain    HPI Shannon Holloway is a 23 y.o. female.   Shannon Holloway presents with complaints of abdominal cramping, headache, nausea with breakfast, started a few days ago. States she Is concerned about possible pregnancy as she missed her depo injection in July. She had unprotected intercourse 9/1, which she is worried about in regards to pregnancy. No other vaginal symptoms. No urinary symptoms. She has low abdominal cramping and mild headache. Hasn't taken any medications for symptoms. Normal bowel movements, last was this morning. No fevers. No URI symptoms. Did have a period in June, but also was diagnosed with BV at that time. History  Of chronic pancreatitis but states this feels different. History  Of anxiety, depression.     ROS per HPI, negative if not otherwise mentioned.      Past Medical History:  Diagnosis Date  . ADHD (attention deficit hyperactivity disorder)   . Anxiety   . Depression     Patient Active Problem List   Diagnosis Date Noted  . History of panic attacks 05/19/2016  . Major depressive disorder 05/19/2016    Past Surgical History:  Procedure Laterality Date  . CHOLECYSTECTOMY  2012  . HERNIA REPAIR  2001    OB History   No obstetric history on file.      Home Medications    Prior to Admission medications   Medication Sig Start Date End Date Taking? Authorizing Provider  buPROPion (WELLBUTRIN XL) 150 MG 24 hr tablet Take 1 tablet (150 mg total) by mouth daily. 02/24/17   Merian Capron, MD  escitalopram (LEXAPRO) 10 MG tablet Take 1 tablet (10 mg total) by mouth daily. Take one a day 02/24/17   Merian Capron, MD  JUNEL 1/20 1-20 MG-MCG tablet Take 1 tablet by mouth daily. 08/08/16   [provider]  medroxyPROGESTERone Acetate (DEPO-PROVERA IM) Inject into  the muscle.    [provider]  PERCOCET 5-325 MG tablet Take 1 tablet by mouth every 6 (six) hours as needed for severe pain. 03/12/17   Lawyer, Harrell Gave, PA-C  promethazine (PHENERGAN) 25 MG tablet Take 1 tablet (25 mg total) by mouth every 6 (six) hours as needed for nausea or vomiting. 03/12/17   Lawyer, Harrell Gave, PA-C  VYVANSE 20 MG capsule Take 1 capsule (20 mg total) by mouth every morning. Patient not taking: Reported on 11/24/2018 11/23/16   Merian Capron, MD    Family History Family History  Problem Relation Age of Onset  . Anxiety disorder Mother   . Anxiety disorder Maternal Grandmother     Social History Social History   Tobacco Use  . Smoking status: Never Smoker  . Smokeless tobacco: Never Used  Substance Use Topics  . Alcohol use: No  . Drug use: Never     Allergies   Patient has no known allergies.   Review of Systems Review of Systems   Physical Exam Triage Vital Signs ED Triage Vitals  Enc Vitals Group     BP 11/24/18 1706 114/81     Pulse Rate 11/24/18 1706 70     Resp 11/24/18 1706 18     Temp 11/24/18 1706 99.3 F (37.4 C)     Temp src --      SpO2 11/24/18 1706 98 %     Weight --  Height --      Head Circumference --      Peak Flow --      Pain Score 11/24/18 1703 6     Pain Loc --      Pain Edu? --      Excl. in GC? --    No data found.  Updated Vital Signs BP 114/81   Pulse 70   Temp 99.3 F (37.4 C)   Resp 18   LMP 09/11/2018   SpO2 98%    Physical Exam Constitutional:      General: She is not in acute distress.    Appearance: She is well-developed.  Cardiovascular:     Rate and Rhythm: Normal rate and regular rhythm.     Heart sounds: Normal heart sounds.  Pulmonary:     Effort: Pulmonary effort is normal.     Breath sounds: Normal breath sounds.  Abdominal:     General: Abdomen is flat.     Palpations: Abdomen is soft.     Tenderness: There is abdominal tenderness in the suprapubic area. There  is no right CVA tenderness, left CVA tenderness, guarding or rebound.     Comments: Very slight subjective tenderness to generalized low abdomen on palpation   Skin:    General: Skin is warm and dry.  Neurological:     Mental Status: She is alert and oriented to person, place, and time.      UC Treatments / Results  Labs (all labs ordered are listed, but only abnormal results are displayed) Labs Reviewed  POCT URINALYSIS DIP (DEVICE)  POCT PREGNANCY, URINE  CERVICOVAGINAL ANCILLARY ONLY    EKG   Radiology No results found.  Procedures Procedures (including critical care time)  Medications Ordered in UC Medications - No data to display  Initial Impression / Assessment and Plan / UC Course  I have reviewed the triage vital signs and the nursing notes.  Pertinent labs & imaging results that were available during my care of the patient were reviewed by me and considered in my medical decision making (see chart for details).     Non toxic. Benign physical exam. Findings are not consistent with acute abdomen. Negative for pregnancy. Discussed that if occurrence without protection took place this week then pregnancy test today is likely too early anyway. Pre menstrual cramping? Patient endorses she generally feels anxious about the situation. She does have an appointment next week with her pcp. Return precautions provided. Patient verbalized understanding and agreeable to plan.   Final Clinical Impressions(s) / UC Diagnoses   Final diagnoses:  Pelvic cramping     Discharge Instructions     Negative for pregnancy, but likely too early, test at least two weeks after intercourse.  We will test your vaginal sample for infectious source of symptoms.  Ibuprofen, heat application as needed.  Plenty of fluids.  Any worsening of symptoms, increased pain, fevers, please go to the er.     ED Prescriptions    None     Controlled Substance Prescriptions [place] Controlled  Substance Registry consulted? Not Applicable   Georgetta HaberBurky, Natalie B, NP 11/25/18 [phone]

## 2018-11-24 NOTE — ED Triage Notes (Signed)
Pt states she missed her depo shot in July due to covid, pt states on Tuesday she started having lower abdominal cramping and feeling bloated. Concerned for pregnancy. Denies vomiting/diarrhea.

## 2018-11-24 NOTE — Discharge Instructions (Signed)
Negative for pregnancy, but likely too early, test at least two weeks after intercourse.  We will test your vaginal sample for infectious source of symptoms.  Ibuprofen, heat application as needed.  Plenty of fluids.  Any worsening of symptoms, increased pain, fevers, please go to the er.

## 2018-11-29 ENCOUNTER — Encounter (HOSPITAL_COMMUNITY): Payer: Self-pay

## 2018-11-29 ENCOUNTER — Telehealth (HOSPITAL_COMMUNITY): Payer: Self-pay | Admitting: Emergency Medicine

## 2018-11-29 LAB — CERVICOVAGINAL ANCILLARY ONLY
Bacterial vaginitis: POSITIVE — AB
Candida vaginitis: NEGATIVE
Chlamydia: NEGATIVE
Neisseria Gonorrhea: NEGATIVE
Trichomonas: NEGATIVE

## 2018-11-29 MED ORDER — METRONIDAZOLE 500 MG PO TABS: 500.0000 mg | ORAL_TABLET | Freq: Two times a day (BID) | ORAL | 0 refills | Status: AC

## 2018-11-29 NOTE — Telephone Encounter (Signed)
Bacterial vaginosis is positive. This was not treated at the urgent care visit.  Flagyl 500 mg BID x 7 days #14 no refills sent to patients pharmacy of choice.    Attempted to reach patient. No answer at this time. Mailbox full.   

## 2018-11-30 ENCOUNTER — Telehealth (HOSPITAL_COMMUNITY): Payer: Self-pay | Admitting: Emergency Medicine

## 2018-11-30 NOTE — Telephone Encounter (Signed)
Patient contacted and made aware of swab results, all questions answered.   

## 2018-12-21 ENCOUNTER — Encounter (HOSPITAL_COMMUNITY): Payer: Self-pay

## 2018-12-21 ENCOUNTER — Other Ambulatory Visit: Payer: Self-pay

## 2018-12-21 ENCOUNTER — Ambulatory Visit (HOSPITAL_COMMUNITY)
Admission: EM | Admit: 2018-12-21 | Discharge: 2018-12-21 | Disposition: A | Discharge status: Home / Self Care | Payer: BC Managed Care – PPO | Attending: Family Medicine | Admitting: Family Medicine

## 2018-12-21 DIAGNOSIS — R1010 Upper abdominal pain, unspecified: Secondary | ICD-10-CM | POA: Diagnosis not present

## 2018-12-21 DIAGNOSIS — Z3202 Encounter for pregnancy test, result negative: Secondary | ICD-10-CM | POA: Diagnosis not present

## 2018-12-21 LAB — POCT URINALYSIS DIP (DEVICE)
Bilirubin Urine: NEGATIVE
Glucose, UA: NEGATIVE mg/dL
Hgb urine dipstick: NEGATIVE
Ketones, ur: NEGATIVE mg/dL
Leukocytes,Ua: NEGATIVE
Nitrite: NEGATIVE
Protein, ur: NEGATIVE mg/dL
Specific Gravity, Urine: >=1.03 (ref 1.005–1.030)
Urobilinogen, UA: 0.2 mg/dL (ref 0.0–1.0)
pH: 6 (ref 5.0–8.0)

## 2018-12-21 LAB — CBC
HCT: 41.2 % (ref 36.0–46.0)
Hemoglobin: 12.8 g/dL (ref 12.0–15.0)
MCH: 28.5 pg (ref 26.0–34.0)
MCHC: 31.1 g/dL (ref 30.0–36.0)
MCV: 91.8 fL (ref 80.0–100.0)
Platelets: 319 10*3/uL (ref 150–400)
RBC: 4.49 MIL/uL (ref 3.87–5.11)
RDW: 12.7 % (ref 11.5–15.5)
WBC: 6.4 10*3/uL (ref 4.0–10.5)
nRBC: 0 % (ref 0.0–0.2)

## 2018-12-21 LAB — COMPREHENSIVE METABOLIC PANEL
ALT: 26 U/L (ref 0–44)
AST: 27 U/L (ref 15–41)
Albumin: 3.6 g/dL (ref 3.5–5.0)
Alkaline Phosphatase: 103 U/L (ref 38–126)
Anion gap: 6 (ref 5–15)
BUN: <5 mg/dL — ABNORMAL LOW (ref 6–20)
CO2: 26 mmol/L (ref 22–32)
Calcium: 9.3 mg/dL (ref 8.9–10.3)
Chloride: 107 mmol/L (ref 98–111)
Creatinine, Ser: 0.81 mg/dL (ref 0.44–1.00)
GFR calc Af Amer: >60 mL/min (ref >60)
GFR calc non Af Amer: >60 mL/min (ref >60)
Glucose, Bld: 102 mg/dL — ABNORMAL HIGH (ref 70–99)
Potassium: 4.4 mmol/L (ref 3.5–5.1)
Sodium: 139 mmol/L (ref 135–145)
Total Bilirubin: 0.5 mg/dL (ref 0.3–1.2)
Total Protein: 7.7 g/dL (ref 6.5–8.1)

## 2018-12-21 LAB — LIPASE, BLOOD: Lipase: 41 U/L (ref 11–51)

## 2018-12-21 LAB — POCT PREGNANCY, URINE: Preg Test, Ur: NEGATIVE

## 2018-12-21 MED ORDER — IBUPROFEN 800 MG PO TABS
800.0000 mg | ORAL_TABLET | Freq: Once | ORAL | Status: AC
  Administered 2018-12-21: 800 mg via ORAL

## 2018-12-21 MED ORDER — ONDANSETRON 4 MG PO TBDP
4.0000 mg | ORAL_TABLET | Freq: Once | ORAL | Status: AC
  Administered 2018-12-21: 4 mg via ORAL

## 2018-12-21 MED ORDER — IBUPROFEN 800 MG PO TABS: 800.0000 mg | ORAL_TABLET | Freq: Three times a day (TID) | ORAL | 0 refills | Status: DC

## 2018-12-21 MED ORDER — ONDANSETRON 4 MG PO TBDP: 4.0000 mg | ORAL_TABLET | Freq: Three times a day (TID) | ORAL | 0 refills | Status: DC | PRN

## 2018-12-21 NOTE — Discharge Instructions (Signed)
Treating you for possible pancreatic flare.  We will call you with any abnormal results. Take the medication as prescribed, rest and stay hydrated.  If your pain worsens or you start having severe nausea or vomiting you will need to go the hospital.

## 2018-12-21 NOTE — ED Triage Notes (Signed)
Pt states she woke up with abdominal pain x 2 days.

## 2018-12-21 NOTE — ED Provider Notes (Signed)
[facility]    CSN: [id number] Arrival date & time: 12/21/18  1000      History   Chief Complaint Chief Complaint  Patient presents with  . Abdominal Pain    HPI Shannon Holloway is a 23 y.o. female.   Patient is a 23 year old female with past medical history of chronic pancreatitis, anxiety and depression.  She presents today with right upper quadrant and epigastric discomfort that started this morning upon waking up. She has had some associated nausea but without vomiting.  Symptoms have been constant, waxing and waning describes as dull ache at times and sharp and stabbing at others.  Soft stools.  Last bowel movement was today.  No blood in stool.  She is currently taking medicine for bacterial infection.  Most of the symptoms have resolved from previous visit.  No fever, chills, body aches.  Pain 4/10  ROS per HPI    Abdominal Pain   Past Medical History:  Diagnosis Date  . ADHD (attention deficit hyperactivity disorder)   . Anxiety   . Depression     Patient Active Problem List   Diagnosis Date Noted  . History of panic attacks 05/19/2016  . Major depressive disorder 05/19/2016    Past Surgical History:  Procedure Laterality Date  . CHOLECYSTECTOMY  2012  . HERNIA REPAIR  2001    OB History   No obstetric history on file.      Home Medications    Prior to Admission medications   Medication Sig Start Date End Date Taking? Authorizing Provider  ibuprofen (ADVIL) 800 MG tablet Take 1 tablet (800 mg total) by mouth 3 (three) times daily. 12/21/18   Loura Halt A, NP  JUNEL 1/20 1-20 MG-MCG tablet Take 1 tablet by mouth daily. 08/08/16   [provider]  medroxyPROGESTERone Acetate (DEPO-PROVERA IM) Inject into the muscle.    [provider]  ondansetron (ZOFRAN ODT) 4 MG disintegrating tablet Take 1 tablet (4 mg total) by mouth every 8 (eight) hours as needed for nausea or vomiting. 12/21/18   Dhriti Fales, Tressia Miners A, NP  buPROPion  (WELLBUTRIN XL) 150 MG 24 hr tablet Take 1 tablet (150 mg total) by mouth daily. Patient not taking: Reported on 12/21/2018 02/24/17 12/21/18  Merian Capron, MD  escitalopram (LEXAPRO) 10 MG tablet Take 1 tablet (10 mg total) by mouth daily. Take one a day Patient not taking: Reported on 12/21/2018 02/24/17 12/21/18  Merian Capron, MD  promethazine (PHENERGAN) 25 MG tablet Take 1 tablet (25 mg total) by mouth every 6 (six) hours as needed for nausea or vomiting. Patient not taking: Reported on 12/21/2018 03/12/17 12/21/18  Lawyer, Harrell Gave, PA-C  VYVANSE 20 MG capsule Take 1 capsule (20 mg total) by mouth every morning. Patient not taking: Reported on 11/24/2018 11/23/16 12/21/18  Merian Capron, MD    Family History Family History  Problem Relation Age of Onset  . Anxiety disorder Mother   . Anxiety disorder Maternal Grandmother     Social History Social History   Tobacco Use  . Smoking status: Never Smoker  . Smokeless tobacco: Never Used  Substance Use Topics  . Alcohol use: Yes  . Drug use: Never     Allergies   Patient has no known allergies.   Review of Systems Review of Systems  Gastrointestinal: Positive for abdominal pain.     Physical Exam Triage Vital Signs ED Triage Vitals  Enc Vitals Group     BP 12/21/18 1031 130/90  Pulse Rate 12/21/18 1031 66     Resp 12/21/18 1031 18     Temp 12/21/18 1031 98.5 F (36.9 C)     Temp Source 12/21/18 1031 Oral     SpO2 12/21/18 1031 100 %     Weight 12/21/18 1028 220 lb (99.8 kg)     Height --      Head Circumference --      Peak Flow --      Pain Score 12/21/18 1028 4     Pain Loc --      Pain Edu? --      Excl. in GC? --    No data found.  Updated Vital Signs BP 130/90 (BP Location: Right Arm)   Pulse 66   Temp 98.5 F (36.9 C) (Oral)   Resp 18   Wt 220 lb (99.8 kg)   SpO2 100%   BMI 33.45 kg/m   Visual Acuity Right Eye Distance:   Left Eye Distance:   Bilateral Distance:    Right Eye Near:    Left Eye Near:    Bilateral Near:     Physical Exam Vitals signs and nursing note reviewed.  Constitutional:      General: She is not in acute distress.    Appearance: She is well-developed. She is not ill-appearing, toxic-appearing or diaphoretic.  HENT:     Head: Normocephalic and atraumatic.  Cardiovascular:     Rate and Rhythm: Normal rate and regular rhythm.  Pulmonary:     Effort: Pulmonary effort is normal.     Breath sounds: Normal breath sounds.  Abdominal:     Palpations: Abdomen is soft.     Tenderness: There is abdominal tenderness in the right upper quadrant and epigastric area. There is no right CVA tenderness, left CVA tenderness, guarding or rebound.  Skin:    General: Skin is warm and dry.  Neurological:     Mental Status: She is alert.  Psychiatric:        Mood and Affect: Mood normal.      UC Treatments / Results  Labs (all labs ordered are listed, but only abnormal results are displayed) Labs Reviewed  CBC  COMPREHENSIVE METABOLIC PANEL  LIPASE, BLOOD  POC URINE PREG, ED    EKG   Radiology No results found.  Procedures Procedures (including critical care time)  Medications Ordered in UC Medications  ondansetron (ZOFRAN-ODT) disintegrating tablet 4 mg (has no administration in time range)  ibuprofen (ADVIL) tablet 800 mg (has no administration in time range)    Initial Impression / Assessment and Plan / UC Course  I have reviewed the triage vital signs and the nursing notes.  Pertinent labs & imaging results that were available during my care of the patient were reviewed by me and considered in my medical decision making (see chart for details).     Patient is 23 year old female with chronic pancreatitis here with 1 day of right upper quadrant and mid abdominal discomfort. Symptoms similar to her previous pancreatic flareups.  We will check lab work. Ibuprofen and Zofran given here for pain and nausea. Labs pending  Final Clinical  Impressions(s) / UC Diagnoses   Final diagnoses:  Pain of upper abdomen     Discharge Instructions     Treating you for possible pancreatic flare.  We will call you with any abnormal results. Take the medication as prescribed, rest and stay hydrated.  If your pain worsens or you start having severe nausea or vomiting  you will need to go the hospital.    ED Prescriptions    Medication Sig Dispense Auth. Provider   ibuprofen (ADVIL) 800 MG tablet Take 1 tablet (800 mg total) by mouth 3 (three) times daily. 21 tablet Shannon Holloway A, NP   ondansetron (ZOFRAN ODT) 4 MG disintegrating tablet Take 1 tablet (4 mg total) by mouth every 8 (eight) hours as needed for nausea or vomiting. 20 tablet Shannon Holloway A, NP     PDMP not reviewed this encounter.   Janace Aris, NP 12/21/18 1117

## 2018-12-30 ENCOUNTER — Ambulatory Visit (HOSPITAL_COMMUNITY)
Admission: EM | Admit: 2018-12-30 | Discharge: 2018-12-30 | Disposition: A | Discharge status: Home / Self Care | Payer: BC Managed Care – PPO | Attending: Family Medicine | Admitting: Family Medicine

## 2018-12-30 ENCOUNTER — Other Ambulatory Visit: Payer: Self-pay

## 2018-12-30 ENCOUNTER — Encounter (HOSPITAL_COMMUNITY): Payer: Self-pay

## 2018-12-30 DIAGNOSIS — F329 Major depressive disorder, single episode, unspecified: Secondary | ICD-10-CM | POA: Insufficient documentation

## 2018-12-30 DIAGNOSIS — F419 Anxiety disorder, unspecified: Secondary | ICD-10-CM | POA: Diagnosis not present

## 2018-12-30 DIAGNOSIS — J029 Acute pharyngitis, unspecified: Secondary | ICD-10-CM

## 2018-12-30 DIAGNOSIS — F909 Attention-deficit hyperactivity disorder, unspecified type: Secondary | ICD-10-CM | POA: Diagnosis not present

## 2018-12-30 DIAGNOSIS — Z20828 Contact with and (suspected) exposure to other viral communicable diseases: Secondary | ICD-10-CM | POA: Diagnosis not present

## 2018-12-30 DIAGNOSIS — Z79899 Other long term (current) drug therapy: Secondary | ICD-10-CM | POA: Diagnosis not present

## 2018-12-30 DIAGNOSIS — J028 Acute pharyngitis due to other specified organisms: Secondary | ICD-10-CM | POA: Diagnosis not present

## 2018-12-30 DIAGNOSIS — Z793 Long term (current) use of hormonal contraceptives: Secondary | ICD-10-CM | POA: Insufficient documentation

## 2018-12-30 DIAGNOSIS — B9789 Other viral agents as the cause of diseases classified elsewhere: Secondary | ICD-10-CM | POA: Insufficient documentation

## 2018-12-30 LAB — POCT RAPID STREP A: Streptococcus, Group A Screen (Direct): NEGATIVE

## 2018-12-30 NOTE — ED Triage Notes (Signed)
Patient presents to Urgent Care with complaints of sore throat mostly on the right side since about a week ago. Patient reports she thought it was because she always sleeps with her fan on and the weather has changed, but a week later it is still sore.

## 2018-12-30 NOTE — ED Provider Notes (Signed)
[facility]    CSN: [id number] Arrival date & time: 12/30/18  1010      History   Chief Complaint Chief Complaint  Patient presents with  . Sore Throat    HPI Shannon Holloway is a 23 y.o. female.    Sore Throat This is a new problem. The current episode started more than 2 days ago. The problem occurs constantly. The problem has not changed since onset.Pertinent negatives include no chest pain, no abdominal pain, no headaches and no shortness of breath. The symptoms are aggravated by swallowing. Nothing relieves the symptoms. She has tried nothing for the symptoms.    Past Medical History:  Diagnosis Date  . ADHD (attention deficit hyperactivity disorder)   . Anxiety   . Depression     Patient Active Problem List   Diagnosis Date Noted  . History of panic attacks 05/19/2016  . Major depressive disorder 05/19/2016    Past Surgical History:  Procedure Laterality Date  . CHOLECYSTECTOMY  2012  . HERNIA REPAIR  2001    OB History   No obstetric history on file.      Home Medications    Prior to Admission medications   Medication Sig Start Date End Date Taking? Authorizing Provider  ibuprofen (ADVIL) 800 MG tablet Take 1 tablet (800 mg total) by mouth 3 (three) times daily. 12/21/18   Dahlia Byes A, NP  JUNEL 1/20 1-20 MG-MCG tablet Take 1 tablet by mouth daily. 08/08/16   [provider]  medroxyPROGESTERone Acetate (DEPO-PROVERA IM) Inject into the muscle.    [provider]  ondansetron (ZOFRAN ODT) 4 MG disintegrating tablet Take 1 tablet (4 mg total) by mouth every 8 (eight) hours as needed for nausea or vomiting. 12/21/18   Wallace Gappa, Gloris Manchester A, NP  buPROPion (WELLBUTRIN XL) 150 MG 24 hr tablet Take 1 tablet (150 mg total) by mouth daily. Patient not taking: Reported on 12/21/2018 02/24/17 12/21/18  Thresa Ross, MD  escitalopram (LEXAPRO) 10 MG tablet Take 1 tablet (10 mg total) by mouth daily. Take one a day Patient not taking:  Reported on 12/21/2018 02/24/17 12/21/18  Thresa Ross, MD  promethazine (PHENERGAN) 25 MG tablet Take 1 tablet (25 mg total) by mouth every 6 (six) hours as needed for nausea or vomiting. Patient not taking: Reported on 12/21/2018 03/12/17 12/21/18  Lawyer, Cristal Deer, PA-C  VYVANSE 20 MG capsule Take 1 capsule (20 mg total) by mouth every morning. Patient not taking: Reported on 11/24/2018 11/23/16 12/21/18  Thresa Ross, MD    Family History Family History  Problem Relation Age of Onset  . Anxiety disorder Mother   . Anxiety disorder Maternal Grandmother     Social History Social History   Tobacco Use  . Smoking status: Never Smoker  . Smokeless tobacco: Never Used  Substance Use Topics  . Alcohol use: Yes    Comment: social  . Drug use: Never     Allergies   Patient has no known allergies.   Review of Systems Review of Systems  Respiratory: Negative for shortness of breath.   Cardiovascular: Negative for chest pain.  Gastrointestinal: Negative for abdominal pain.  Neurological: Negative for headaches.     Physical Exam Triage Vital Signs ED Triage Vitals  Enc Vitals Group     BP 12/30/18 1025 118/77     Pulse Rate 12/30/18 1025 75     Resp 12/30/18 1025 16     Temp 12/30/18 1025 99.1 F (37.3 C)  Temp Source 12/30/18 1025 Oral     SpO2 12/30/18 1025 99 %     Weight --      Height --      Head Circumference --      Peak Flow --      Pain Score 12/30/18 1024 9     Pain Loc --      Pain Edu? --      Excl. in [place]? --    No data found.  Updated Vital Signs BP 118/77 (BP Location: Left Arm)   Pulse 75   Temp 99.1 F (37.3 C) (Oral)   Resp 16   SpO2 99%   Visual Acuity Right Eye Distance:   Left Eye Distance:   Bilateral Distance:    Right Eye Near:   Left Eye Near:    Bilateral Near:     Physical Exam Vitals signs and nursing note reviewed.  Constitutional:      General: She is not in acute distress.    Appearance: She is well-developed.  She is not ill-appearing, toxic-appearing or diaphoretic.  HENT:     Head: Normocephalic and atraumatic.     Right Ear: Tympanic membrane and ear canal normal.     Left Ear: Tympanic membrane and ear canal normal.     Nose: No congestion or rhinorrhea.     Mouth/Throat:     Pharynx: Uvula midline. Posterior oropharyngeal erythema present.     Tonsils: No tonsillar exudate or tonsillar abscesses. 2+ on the right. 2+ on the left.  Eyes:     Conjunctiva/sclera: Conjunctivae normal.  Neck:     Musculoskeletal: Normal range of motion.  Pulmonary:     Effort: Pulmonary effort is normal.  Lymphadenopathy:     Cervical: Cervical adenopathy present.  Skin:    General: Skin is warm and dry.     Findings: No rash.  Neurological:     Mental Status: She is alert.  Psychiatric:        Mood and Affect: Mood normal.      UC Treatments / Results  Labs (all labs ordered are listed, but only abnormal results are displayed) Labs Reviewed  NOVEL CORONAVIRUS, NAA (HOSP ORDER, SEND-OUT TO REF LAB; TAT 18-24 HRS)  CULTURE, GROUP A STREP [facility])  POCT RAPID STREP A    EKG   Radiology No results found.  Procedures Procedures (including critical care time)  Medications Ordered in UC Medications - No data to display  Initial Impression / Assessment and Plan / UC Course  I have reviewed the triage vital signs and the nursing notes.  Pertinent labs & imaging results that were available during my care of the patient were reviewed by me and considered in my medical decision making (see chart for details).     Viral pharyngitis-treating with over-the-counter symptomatic management Swab sent for culture Final Clinical Impressions(s) / UC Diagnoses   Final diagnoses:  Viral pharyngitis     Discharge Instructions     Take the ibuprofen as needed for pain Warm salt water gargles  and throat spray can help.  We will send your strep for culture and call you if anything changes. Follow up  as needed for continued or worsening symptoms     ED Prescriptions    None     PDMP not reviewed this encounter.   Loura Halt A, NP 12/30/18 1049

## 2018-12-30 NOTE — Discharge Instructions (Addendum)
Take the ibuprofen as needed for pain Warm salt water gargles  and throat spray can help.  We will send your strep for culture and call you if anything changes. Follow up as needed for continued or worsening symptoms

## 2018-12-31 LAB — NOVEL CORONAVIRUS, NAA (HOSP ORDER, SEND-OUT TO REF LAB; TAT 18-24 HRS): SARS-CoV-2, NAA: NOT DETECTED

## 2019-01-02 ENCOUNTER — Telehealth (HOSPITAL_COMMUNITY): Payer: Self-pay | Admitting: Emergency Medicine

## 2019-01-02 NOTE — Telephone Encounter (Signed)
Pt notified of negative covid results. All questions answered.

## 2019-01-04 LAB — CULTURE, GROUP A STREP (THRC)

## 2019-02-19 ENCOUNTER — Other Ambulatory Visit: Payer: Self-pay

## 2019-02-19 ENCOUNTER — Ambulatory Visit (HOSPITAL_COMMUNITY)
Admission: EM | Admit: 2019-02-19 | Discharge: 2019-02-19 | Disposition: A | Discharge status: Home / Self Care | Payer: BC Managed Care – PPO | Attending: Emergency Medicine | Admitting: Emergency Medicine

## 2019-02-19 ENCOUNTER — Encounter (HOSPITAL_COMMUNITY): Payer: Self-pay

## 2019-02-19 DIAGNOSIS — Z3202 Encounter for pregnancy test, result negative: Secondary | ICD-10-CM

## 2019-02-19 DIAGNOSIS — N3000 Acute cystitis without hematuria: Secondary | ICD-10-CM

## 2019-02-19 LAB — POCT URINALYSIS DIP (DEVICE)
Bilirubin Urine: NEGATIVE
Glucose, UA: NEGATIVE mg/dL
Hgb urine dipstick: NEGATIVE
Nitrite: POSITIVE — AB
Protein, ur: NEGATIVE mg/dL
Specific Gravity, Urine: >=1.03 (ref 1.005–1.030)
Urobilinogen, UA: 0.2 mg/dL (ref 0.0–1.0)
pH: 5.5 (ref 5.0–8.0)

## 2019-02-19 LAB — POC URINE PREG, ED: Preg Test, Ur: NEGATIVE

## 2019-02-19 LAB — POCT PREGNANCY, URINE: Preg Test, Ur: NEGATIVE

## 2019-02-19 MED ORDER — NITROFURANTOIN MACROCRYSTAL 100 MG PO CAPS: 100.0000 mg | ORAL_CAPSULE | Freq: Four times a day (QID) | ORAL | 0 refills | Status: AC

## 2019-02-19 NOTE — ED Triage Notes (Signed)
Pt reports this morning she was vomiting and then she started having abdominal pain and headache. Pt reports vaginal irritation x 1 day.

## 2019-02-19 NOTE — ED Provider Notes (Addendum)
[facility]    CSN: [id number] Arrival date & time: 02/19/19  1825      History   Chief Complaint Chief Complaint  Patient presents with  . Nausea  . Abdominal Pain  . Headache    HPI Shannon Holloway is a 23 y.o. female.   The history is provided by the patient. No language interpreter was used.  Abdominal Pain Pain location:  LLQ and RLQ Pain quality: aching   Pain radiates to:  Does not radiate Pain severity:  Moderate Duration:  1 day Timing:  Constant Progression:  Worsening Chronicity:  New Context: not diet changes, not recent illness, not recent travel and not sick contacts   Relieved by:  Nothing Worsened by:  Nothing Ineffective treatments:  None tried Associated symptoms: nausea and vomiting   Associated symptoms: no chills, no diarrhea, no dysuria, no fatigue and no fever   Associated symptoms comment:  Headache Risk factors: no alcohol abuse, no NSAID use and not pregnant   Headache Associated symptoms: abdominal pain, nausea and vomiting   Associated symptoms: no diarrhea, no fatigue and no fever     Past Medical History:  Diagnosis Date  . ADHD (attention deficit hyperactivity disorder)   . Anxiety   . Depression     Patient Active Problem List   Diagnosis Date Noted  . History of panic attacks 05/19/2016  . Major depressive disorder 05/19/2016    Past Surgical History:  Procedure Laterality Date  . CHOLECYSTECTOMY  2012  . HERNIA REPAIR  2001    OB History   No obstetric history on file.      Home Medications    Prior to Admission medications   Medication Sig Start Date End Date Taking? Authorizing Provider  ibuprofen (ADVIL) 800 MG tablet Take 1 tablet (800 mg total) by mouth 3 (three) times daily. 12/21/18   Loura Halt A, NP  JUNEL 1/20 1-20 MG-MCG tablet Take 1 tablet by mouth daily. 08/08/16   [provider]  medroxyPROGESTERone Acetate (DEPO-PROVERA IM) Inject into the muscle.    [provider]  nitrofurantoin (MACRODANTIN) 100 MG capsule Take 1 capsule (100 mg total) by mouth 4 (four) times daily for 5 days. 02/19/19 02/24/19  Claretha Townshend, Darrelyn Hillock, FNP  ondansetron (ZOFRAN ODT) 4 MG disintegrating tablet Take 1 tablet (4 mg total) by mouth every 8 (eight) hours as needed for nausea or vomiting. 12/21/18   Bast, Tressia Miners A, NP  buPROPion (WELLBUTRIN XL) 150 MG 24 hr tablet Take 1 tablet (150 mg total) by mouth daily. Patient not taking: Reported on 12/21/2018 02/24/17 12/21/18  Merian Capron, MD  escitalopram (LEXAPRO) 10 MG tablet Take 1 tablet (10 mg total) by mouth daily. Take one a day Patient not taking: Reported on 12/21/2018 02/24/17 12/21/18  Merian Capron, MD  promethazine (PHENERGAN) 25 MG tablet Take 1 tablet (25 mg total) by mouth every 6 (six) hours as needed for nausea or vomiting. Patient not taking: Reported on 12/21/2018 03/12/17 12/21/18  Lawyer, Harrell Gave, PA-C  VYVANSE 20 MG capsule Take 1 capsule (20 mg total) by mouth every morning. Patient not taking: Reported on 11/24/2018 11/23/16 12/21/18  Merian Capron, MD    Family History Family History  Problem Relation Age of Onset  . Anxiety disorder Mother   . Anxiety disorder Maternal Grandmother     Social History Social History   Tobacco Use  . Smoking status: Never Smoker  . Smokeless tobacco: Never Used  Substance Use Topics  .  Alcohol use: Yes    Comment: social  . Drug use: Never     Allergies   Patient has no known allergies.   Review of Systems Review of Systems  Constitutional: Negative for activity change, appetite change, chills, fatigue and fever.  HENT: Negative.   Respiratory: Negative.   Cardiovascular: Negative.   Gastrointestinal: Positive for abdominal pain, nausea and vomiting. Negative for diarrhea.  Genitourinary: Negative for dysuria.  Neurological: Positive for headaches.  ROS: all other are negatives   Physical Exam Triage Vital Signs ED Triage Vitals  Enc Vitals  Group     BP 02/19/19 1919 119/73     Pulse Rate 02/19/19 1919 66     Resp 02/19/19 1919 16     Temp 02/19/19 1919 98.9 F (37.2 C)     Temp Source 02/19/19 1919 Oral     SpO2 02/19/19 1919 99 %     Weight --      Height --      Head Circumference --      Peak Flow --      Pain Score 02/19/19 1917 4     Pain Loc --      Pain Edu? --      Excl. in [place]? --    No data found.  Updated Vital Signs BP 119/73 (BP Location: Right Arm)   Pulse 66   Temp 98.9 F (37.2 C) (Oral)   Resp 16   SpO2 99%   Visual Acuity Right Eye Distance:   Left Eye Distance:   Bilateral Distance:    Right Eye Near:   Left Eye Near:    Bilateral Near:     Physical Exam Vitals signs and nursing note reviewed.  Constitutional:      General: She is not in acute distress.    Appearance: She is well-developed and normal weight. She is not ill-appearing or toxic-appearing.  HENT:     Nose: Nose normal.  Cardiovascular:     Rate and Rhythm: Normal rate and regular rhythm.     Pulses: Normal pulses.     Heart sounds: No murmur.  Pulmonary:     Effort: Pulmonary effort is normal. No respiratory distress.     Breath sounds: Normal breath sounds. No stridor.  Abdominal:     General: Abdomen is flat. Bowel sounds are normal. There is no distension.     Palpations: Abdomen is soft. There is no mass.     Tenderness: There is no abdominal tenderness.     Hernia: No hernia is present.  Genitourinary:    Vagina: No vaginal discharge.  Neurological:     Mental Status: She is alert.      UC Treatments / Results  Labs (all labs ordered are listed, but only abnormal results are displayed) Labs Reviewed  POCT URINALYSIS DIP (DEVICE) - Abnormal; Notable for the following components:      Result Value   Ketones, ur TRACE (*)    Nitrite POSITIVE (*)    Leukocytes,Ua SMALL (*)    All other components within normal limits  POCT PREGNANCY, URINE  POC URINE PREG, ED    EKG   Radiology No results  found.  Procedures Procedures (including critical care time)  Medications Ordered in UC Medications - No data to display  Initial Impression / Assessment and Plan / UC Course  I have reviewed the triage vital signs and the nursing notes.  Pertinent labs & imaging results that were available during my  care of the patient were reviewed by me and considered in my medical decision making (see chart for details).   UA show presence of nitrite which is consistent with UTI Nitrofurantoin was ordered and patient was advise to follow up with PCP Final Clinical Impressions(s) / UC Diagnoses   Final diagnoses:  Acute cystitis without hematuria     Discharge Instructions     Advise plenty of water Complete the antibiotic as prescribed Follow up with PCP Return if symptom get worse     ED Prescriptions    Medication Sig Dispense Auth. Provider   nitrofurantoin (MACRODANTIN) 100 MG capsule Take 1 capsule (100 mg total) by mouth 4 (four) times daily for 5 days. 20 capsule Wallis Spizzirri, Zachery Dakins, FNP     PDMP not reviewed this encounter.   Durward Parcel, FNP 02/19/19 2014    Durward Parcel, FNP 02/19/19 2015

## 2019-02-19 NOTE — Discharge Instructions (Addendum)
Advise plenty of water Complete the antibiotic as prescribed Follow up with PCP Return if symptom get worse

## 2019-04-03 ENCOUNTER — Ambulatory Visit (HOSPITAL_COMMUNITY)
Admission: EM | Admit: 2019-04-03 | Discharge: 2019-04-03 | Disposition: A | Discharge status: Home / Self Care | Payer: BC Managed Care – PPO | Attending: Urgent Care | Admitting: Urgent Care

## 2019-04-03 ENCOUNTER — Other Ambulatory Visit: Payer: Self-pay

## 2019-04-03 ENCOUNTER — Encounter (HOSPITAL_COMMUNITY): Payer: Self-pay

## 2019-04-03 DIAGNOSIS — R112 Nausea with vomiting, unspecified: Secondary | ICD-10-CM | POA: Diagnosis not present

## 2019-04-03 DIAGNOSIS — Z793 Long term (current) use of hormonal contraceptives: Secondary | ICD-10-CM | POA: Diagnosis not present

## 2019-04-03 DIAGNOSIS — R3915 Urgency of urination: Secondary | ICD-10-CM | POA: Diagnosis not present

## 2019-04-03 DIAGNOSIS — Z87440 Personal history of urinary (tract) infections: Secondary | ICD-10-CM | POA: Diagnosis not present

## 2019-04-03 DIAGNOSIS — R1084 Generalized abdominal pain: Secondary | ICD-10-CM | POA: Diagnosis not present

## 2019-04-03 DIAGNOSIS — R300 Dysuria: Secondary | ICD-10-CM | POA: Insufficient documentation

## 2019-04-03 DIAGNOSIS — Z791 Long term (current) use of non-steroidal anti-inflammatories (NSAID): Secondary | ICD-10-CM | POA: Insufficient documentation

## 2019-04-03 DIAGNOSIS — Z3202 Encounter for pregnancy test, result negative: Secondary | ICD-10-CM | POA: Diagnosis not present

## 2019-04-03 LAB — POC URINE PREG, ED: Preg Test, Ur: NEGATIVE

## 2019-04-03 LAB — POCT URINALYSIS DIP (DEVICE)
Bilirubin Urine: NEGATIVE
Glucose, UA: NEGATIVE mg/dL
Hgb urine dipstick: NEGATIVE
Ketones, ur: NEGATIVE mg/dL
Leukocytes,Ua: NEGATIVE
Nitrite: NEGATIVE
Protein, ur: NEGATIVE mg/dL
Specific Gravity, Urine: >=1.03 (ref 1.005–1.030)
Urobilinogen, UA: 0.2 mg/dL (ref 0.0–1.0)
pH: 7 (ref 5.0–8.0)

## 2019-04-03 LAB — POCT PREGNANCY, URINE: Preg Test, Ur: NEGATIVE

## 2019-04-03 MED ORDER — NITROFURANTOIN MONOHYD MACRO 100 MG PO CAPS: 100.0000 mg | ORAL_CAPSULE | Freq: Two times a day (BID) | ORAL | 0 refills | Status: DC

## 2019-04-03 NOTE — ED Triage Notes (Signed)
Pt c/o mid abd pain/vomiting since 0600 also c/o dysuria sx. Dx and tx for uti and bv approx 3 weeks ago; states compliant with full course abx therapy for same. Also states on depoprovera for birth control and had abnormal vag bleeding 2 weeks ago. Denies URI sx or gen fatigue/body aches. States h/o pancreatitis; denies back pain.  Took 600 mg ibuprofen approx 0715 but not sure retained med 2/2 vomiting.

## 2019-04-03 NOTE — ED Provider Notes (Signed)
[facility]   MRN: [id number] DOB: 28-Oct-1995  Subjective:   Shannon Holloway is a 24 y.o. female presenting for acute onset worsening dysuria, urinary urgency, nausea with vomiting, mid abdominal pain today. Had UTI, STI testing and tx for UTI, BV ~3 weeks ago. She is in a stable relationship, low suspicion for STI but is not opposed to retesting. Has regular depo injections for contraception.   No current facility-administered medications for this encounter.  Current Outpatient Medications:  .  ibuprofen (ADVIL) 800 MG tablet, Take 1 tablet (800 mg total) by mouth 3 (three) times daily., Disp: 21 tablet, Rfl: 0 .  JUNEL 1/20 1-20 MG-MCG tablet, Take 1 tablet by mouth daily., Disp: , Rfl: 5 .  medroxyPROGESTERone Acetate (DEPO-PROVERA IM), Inject into the muscle., Disp: , Rfl:  .  ondansetron (ZOFRAN ODT) 4 MG disintegrating tablet, Take 1 tablet (4 mg total) by mouth every 8 (eight) hours as needed for nausea or vomiting., Disp: 20 tablet, Rfl: 0   No Known Allergies  Past Medical History:  Diagnosis Date  . ADHD (attention deficit hyperactivity disorder)   . Anxiety   . Depression   . Pancreatitis 2012     Past Surgical History:  Procedure Laterality Date  . CHOLECYSTECTOMY  2012  . HERNIA REPAIR  2001    Family History  Problem Relation Age of Onset  . Anxiety disorder Mother   . Anxiety disorder Maternal Grandmother     Social History   Tobacco Use  . Smoking status: Never Smoker  . Smokeless tobacco: Never Used  Substance Use Topics  . Alcohol use: Yes    Comment: social  . Drug use: Never    Review of Systems  Constitutional: Negative for fever and malaise/fatigue.  HENT: Negative for congestion, ear pain, sinus pain and sore throat.   Eyes: Negative for blurred vision, double vision, discharge and redness.  Respiratory: Negative for cough, hemoptysis, shortness of breath and wheezing.   Cardiovascular: Negative for chest pain.    Gastrointestinal: Positive for abdominal pain, nausea and vomiting. Negative for blood in stool, constipation and diarrhea.  Genitourinary: Positive for dysuria and urgency. Negative for flank pain, frequency and hematuria.  Musculoskeletal: Negative for myalgias.  Skin: Negative for rash.  Neurological: Negative for dizziness, weakness and headaches.  Psychiatric/Behavioral: Negative for depression and substance abuse.     Objective:   Vitals: BP 132/88 (BP Location: Left Arm)   Pulse 76   Temp 98.8 F (37.1 C) (Oral)   Resp 16   SpO2 99%   Physical Exam Constitutional:      General: She is not in acute distress.    Appearance: Normal appearance. She is well-developed and normal weight. She is not ill-appearing, toxic-appearing or diaphoretic.  HENT:     Head: Normocephalic and atraumatic.     Right Ear: External ear normal.     Left Ear: External ear normal.     Nose: Nose normal.     Mouth/Throat:     Mouth: Mucous membranes are moist.     Pharynx: Oropharynx is clear.  Eyes:     General: No scleral icterus.    Extraocular Movements: Extraocular movements intact.     Pupils: Pupils are equal, round, and reactive to light.  Cardiovascular:     Rate and Rhythm: Normal rate and regular rhythm.     Pulses: Normal pulses.     Heart sounds: Normal heart sounds. No murmur. No friction rub. No gallop.   Pulmonary:  Effort: Pulmonary effort is normal. No respiratory distress.     Breath sounds: Normal breath sounds. No stridor. No wheezing, rhonchi or rales.  Abdominal:     General: Bowel sounds are normal. There is no distension.     Palpations: Abdomen is soft. There is no mass.     Tenderness: There is generalized abdominal tenderness and tenderness in the right upper quadrant, epigastric area, periumbilical area and left upper quadrant. There is no right CVA tenderness, left CVA tenderness, guarding or rebound.  Skin:    General: Skin is warm and dry.      Coloration: Skin is not pale.     Findings: No rash.  Neurological:     General: No focal deficit present.     Mental Status: She is alert and oriented to person, place, and time.  Psychiatric:        Mood and Affect: Mood normal.        Behavior: Behavior normal.        Thought Content: Thought content normal.        Judgment: Judgment normal.     Results for orders placed or performed during the hospital encounter of 04/03/19 (from the past 24 hour(s))  POCT urinalysis dip (device)     Status: None   Collection Time: 04/03/19 11:26 AM  Result Value Ref Range   Glucose, UA NEGATIVE NEGATIVE mg/dL   Bilirubin Urine NEGATIVE NEGATIVE   Ketones, ur NEGATIVE NEGATIVE mg/dL   Specific Gravity, Urine >=1.030 1.005 - 1.030   Hgb urine dipstick NEGATIVE NEGATIVE   pH 7.0 5.0 - 8.0   Protein, ur NEGATIVE NEGATIVE mg/dL   Urobilinogen, UA 0.2 0.0 - 1.0 mg/dL   Nitrite NEGATIVE NEGATIVE   Leukocytes,Ua NEGATIVE NEGATIVE  POC urine pregnancy     Status: None   Collection Time: 04/03/19 11:32 AM  Result Value Ref Range   Preg Test, Ur NEGATIVE NEGATIVE  Pregnancy, urine POC     Status: None   Collection Time: 04/03/19 11:32 AM  Result Value Ref Range   Preg Test, Ur NEGATIVE NEGATIVE    Assessment and Plan :   1. Dysuria   2. Nausea and vomiting, intractability of vomiting not specified, unspecified vomiting type   3. Generalized abdominal pain     Will cover for cystitis with Macrobid, labs pending. Cmet in 12/21/2018 does not support gall bladder disease. Recent STI testing negative per patient but will recheck. Counseled patient on need for close f/u with PCP. Counseled patient on potential for adverse effects with medications prescribed/recommended today, ER and return-to-clinic precautions discussed, patient verbalized understanding.    Jaynee Eagles, PA-C 04/03/19 1432

## 2019-04-04 LAB — URINE CULTURE: Culture: NO GROWTH

## 2019-04-05 ENCOUNTER — Telehealth (HOSPITAL_COMMUNITY): Payer: Self-pay | Admitting: Emergency Medicine

## 2019-04-05 LAB — CERVICOVAGINAL ANCILLARY ONLY
Bacterial vaginitis: POSITIVE — AB
Candida vaginitis: NEGATIVE
Chlamydia: NEGATIVE
Neisseria Gonorrhea: NEGATIVE
Trichomonas: NEGATIVE

## 2019-04-05 MED ORDER — METRONIDAZOLE 500 MG PO TABS: 500.0000 mg | ORAL_TABLET | Freq: Two times a day (BID) | ORAL | 0 refills | Status: AC

## 2019-04-05 NOTE — Telephone Encounter (Signed)
Bacterial vaginosis is positive. Pt needs treatment. Flagyl 500 mg BID x 7 days #14 no refills sent to patients pharmacy of choice.    Attempted to reach patient. No answer at this time. Voicemail left.     

## 2019-06-18 ENCOUNTER — Ambulatory Visit: Payer: BC Managed Care – PPO

## 2019-06-23 ENCOUNTER — Ambulatory Visit: Payer: BC Managed Care – PPO

## 2019-07-06 ENCOUNTER — Ambulatory Visit: Payer: BC Managed Care – PPO

## 2019-07-07 ENCOUNTER — Ambulatory Visit: Payer: BC Managed Care – PPO

## 2019-07-26 ENCOUNTER — Ambulatory Visit: Payer: BC Managed Care – PPO

## 2019-08-04 ENCOUNTER — Other Ambulatory Visit: Payer: Self-pay

## 2019-08-04 ENCOUNTER — Encounter (HOSPITAL_COMMUNITY): Payer: Self-pay

## 2019-08-04 ENCOUNTER — Ambulatory Visit (HOSPITAL_COMMUNITY)
Admission: EM | Admit: 2019-08-04 | Discharge: 2019-08-04 | Disposition: A | Discharge status: Home / Self Care | Payer: BC Managed Care – PPO | Attending: Emergency Medicine | Admitting: Emergency Medicine

## 2019-08-04 DIAGNOSIS — F909 Attention-deficit hyperactivity disorder, unspecified type: Secondary | ICD-10-CM | POA: Diagnosis not present

## 2019-08-04 DIAGNOSIS — Z202 Contact with and (suspected) exposure to infections with a predominantly sexual mode of transmission: Secondary | ICD-10-CM | POA: Diagnosis not present

## 2019-08-04 DIAGNOSIS — Z793 Long term (current) use of hormonal contraceptives: Secondary | ICD-10-CM | POA: Diagnosis not present

## 2019-08-04 DIAGNOSIS — Z79899 Other long term (current) drug therapy: Secondary | ICD-10-CM | POA: Diagnosis not present

## 2019-08-04 DIAGNOSIS — Z113 Encounter for screening for infections with a predominantly sexual mode of transmission: Secondary | ICD-10-CM | POA: Diagnosis not present

## 2019-08-04 DIAGNOSIS — F419 Anxiety disorder, unspecified: Secondary | ICD-10-CM | POA: Diagnosis not present

## 2019-08-04 LAB — POC URINE PREG, ED: Preg Test, Ur: NEGATIVE

## 2019-08-04 NOTE — ED Provider Notes (Signed)
[facility]    CSN: [id number] Arrival date & time: 08/04/19  1002      History   Chief Complaint Chief Complaint  Patient presents with  . SEXUALLY TRANSMITTED DISEASE    HPI Shannon Holloway is a 24 y.o. female presenting today for STD screening.  Patient reports she had unprotected intercourse approximately 5 days ago.  She reports that her menstrual cycle began yesterday which is earlier than normal.  She is concerned about possible STDs as she has had early/irregular bleeding previously with STDs.  She denies any other symptoms, denies fevers, nausea, vomiting, abdominal pain, pelvic pain, abnormal discharge.  Denies urinary symptoms.  Denies rashes or lesions.  Is not currently on birth control.  HPI  Past Medical History:  Diagnosis Date  . ADHD (attention deficit hyperactivity disorder)   . Anxiety   . Depression   . Pancreatitis 2012    Patient Active Problem List   Diagnosis Date Noted  . History of panic attacks 05/19/2016  . Major depressive disorder 05/19/2016    Past Surgical History:  Procedure Laterality Date  . CHOLECYSTECTOMY  2012  . HERNIA REPAIR  2001    OB History   No obstetric history on file.      Home Medications    Prior to Admission medications   Medication Sig Start Date End Date Taking? Authorizing Provider  ibuprofen (ADVIL) 800 MG tablet Take 1 tablet (800 mg total) by mouth 3 (three) times daily. 12/21/18   Dahlia Byes A, NP  JUNEL 1/20 1-20 MG-MCG tablet Take 1 tablet by mouth daily. 08/08/16   [provider]  medroxyPROGESTERone Acetate (DEPO-PROVERA IM) Inject into the muscle.    [provider]  nitrofurantoin, macrocrystal-monohydrate, (MACROBID) 100 MG capsule Take 1 capsule (100 mg total) by mouth 2 (two) times daily. 04/03/19   Wallis Bamberg, PA-C  ondansetron (ZOFRAN ODT) 4 MG disintegrating tablet Take 1 tablet (4 mg total) by mouth every 8 (eight) hours as needed for nausea or vomiting.  12/21/18   Bast, Gloris Manchester A, NP  buPROPion (WELLBUTRIN XL) 150 MG 24 hr tablet Take 1 tablet (150 mg total) by mouth daily. Patient not taking: Reported on 12/21/2018 02/24/17 12/21/18  Thresa Ross, MD  escitalopram (LEXAPRO) 10 MG tablet Take 1 tablet (10 mg total) by mouth daily. Take one a day Patient not taking: Reported on 12/21/2018 02/24/17 12/21/18  Thresa Ross, MD  promethazine (PHENERGAN) 25 MG tablet Take 1 tablet (25 mg total) by mouth every 6 (six) hours as needed for nausea or vomiting. Patient not taking: Reported on 12/21/2018 03/12/17 12/21/18  Lawyer, Cristal Deer, PA-C  VYVANSE 20 MG capsule Take 1 capsule (20 mg total) by mouth every morning. Patient not taking: Reported on 11/24/2018 11/23/16 12/21/18  Thresa Ross, MD    Family History Family History  Problem Relation Age of Onset  . Anxiety disorder Mother   . Anxiety disorder Maternal Grandmother     Social History Social History   Tobacco Use  . Smoking status: Never Smoker  . Smokeless tobacco: Never Used  Substance Use Topics  . Alcohol use: Yes    Comment: social  . Drug use: Never     Allergies   Patient has no known allergies.   Review of Systems Review of Systems  Constitutional: Negative for fever.  Respiratory: Negative for shortness of breath.   Cardiovascular: Negative for chest pain.  Gastrointestinal: Negative for abdominal pain, diarrhea, nausea and vomiting.  Genitourinary: Positive for menstrual  problem and vaginal bleeding. Negative for dysuria, flank pain, genital sores, hematuria, vaginal discharge and vaginal pain.  Musculoskeletal: Negative for back pain.  Skin: Negative for rash.  Neurological: Negative for dizziness, light-headedness and headaches.     Physical Exam Triage Vital Signs ED Triage Vitals  Enc Vitals Group     BP 08/04/19 1012 128/78     Pulse Rate 08/04/19 1012 75     Resp 08/04/19 1012 16     Temp 08/04/19 1012 98 F (36.7 C)     Temp Source 08/04/19 1012  Oral     SpO2 08/04/19 1012 100 %     Weight --      Height --      Head Circumference --      Peak Flow --      Pain Score 08/04/19 1019 0     Pain Loc --      Pain Edu? --      Excl. in GC? --    No data found.  Updated Vital Signs BP 128/78 (BP Location: Left Arm)   Pulse 75   Temp 98 F (36.7 C) (Oral)   Resp 16   LMP 07/15/2019 (Within Days)   SpO2 100%   Visual Acuity Right Eye Distance:   Left Eye Distance:   Bilateral Distance:    Right Eye Near:   Left Eye Near:    Bilateral Near:     Physical Exam Vitals and nursing note reviewed.  Constitutional:      Appearance: She is well-developed.     Comments: No acute distress  HENT:     Head: Normocephalic and atraumatic.     Nose: Nose normal.  Eyes:     Conjunctiva/sclera: Conjunctivae normal.  Cardiovascular:     Rate and Rhythm: Normal rate.  Pulmonary:     Effort: Pulmonary effort is normal. No respiratory distress.  Abdominal:     General: There is no distension.     Comments: Soft, nondistended, nontender to light and deep palpation.abdomen  Musculoskeletal:        General: Normal range of motion.     Cervical back: Neck supple.  Skin:    General: Skin is warm and dry.  Neurological:     Mental Status: She is alert and oriented to person, place, and time.      UC Treatments / Results  Labs (all labs ordered are listed, but only abnormal results are displayed) Labs Reviewed  POC URINE PREG, ED  CERVICOVAGINAL ANCILLARY ONLY    EKG   Radiology No results found.  Procedures Procedures (including critical care time)  Medications Ordered in UC Medications - No data to display  Initial Impression / Assessment and Plan / UC Course  I have reviewed the triage vital signs and the nursing notes.  Pertinent labs & imaging results that were available during my care of the patient were reviewed by me and considered in my medical decision making (see chart for details).     Pregnancy  test negative, vaginal swab pending for STD testing.  Will call with results and provide treatment as needing.  Deferring empiric treatment at this time.  Patient would like to try metronidazole vaginal cream if swab returns positive for BV.  Discussed strict return precautions. Patient verbalized understanding and is agreeable with plan.  Final Clinical Impressions(s) / UC Diagnoses   Final diagnoses:  Screen for STD (sexually transmitted disease)     Discharge Instructions  We are testing you for Gonorrhea, Chlamydia, Trichomonas, Yeast and Bacterial Vaginosis. We will call you if anything is positive and let you know if you require any further treatment. Please inform partners of any positive results.   Please return if symptoms not improving with treatment, development of fever, nausea, vomiting, abdominal pain.     ED Prescriptions    None     PDMP not reviewed this encounter.   Janith Lima, PA-C 08/04/19 1053

## 2019-08-04 NOTE — Discharge Instructions (Addendum)
We are testing you for Gonorrhea, Chlamydia, Trichomonas, Yeast and Bacterial Vaginosis. We will call you if anything is positive and let you know if you require any further treatment. Please inform partners of any positive results.   Please return if symptoms not improving with treatment, development of fever, nausea, vomiting, abdominal pain.  

## 2019-08-04 NOTE — ED Triage Notes (Signed)
Patient requesting STD testing.

## 2019-08-06 ENCOUNTER — Telehealth (HOSPITAL_COMMUNITY): Payer: Self-pay | Admitting: Orthopedic Surgery

## 2019-08-06 LAB — CERVICOVAGINAL ANCILLARY ONLY
Bacterial Vaginitis (gardnerella): POSITIVE — AB
Candida Glabrata: NEGATIVE
Candida Vaginitis: NEGATIVE
Chlamydia: NEGATIVE
Comment: NEGATIVE
Comment: NEGATIVE
Comment: NEGATIVE
Comment: NEGATIVE
Comment: NEGATIVE
Comment: NORMAL
Neisseria Gonorrhea: NEGATIVE
Trichomonas: NEGATIVE

## 2019-08-06 MED ORDER — METRONIDAZOLE 0.75 % VA GEL: 1.0000 | Freq: Every day | VAGINAL | 0 refills | Status: AC

## 2019-11-12 ENCOUNTER — Telehealth (INDEPENDENT_AMBULATORY_CARE_PROVIDER_SITE_OTHER): Payer: No Typology Code available for payment source | Admitting: Psychiatry

## 2019-11-12 ENCOUNTER — Encounter (HOSPITAL_COMMUNITY): Payer: Self-pay | Admitting: Psychiatry

## 2019-11-12 DIAGNOSIS — F331 Major depressive disorder, recurrent, moderate: Secondary | ICD-10-CM | POA: Diagnosis not present

## 2019-11-12 DIAGNOSIS — F411 Generalized anxiety disorder: Secondary | ICD-10-CM | POA: Diagnosis not present

## 2019-11-12 MED ORDER — ESCITALOPRAM OXALATE 10 MG PO TABS: 10.0000 mg | ORAL_TABLET | Freq: Every day | ORAL | 0 refills | Status: DC

## 2019-11-12 NOTE — Progress Notes (Signed)
Psychiatric Initial Adult Assessment   Patient Identification: Shannon Holloway MRN:  [id number] Date of Evaluation:  11/12/2019 Referral Source: primary care and self Chief Complaint:   depression Visit Diagnosis:    ICD-10-CM   1. MDD (major depressive disorder), recurrent episode, moderate (HCC)  F33.1   2. GAD (generalized anxiety disorder)  F41.1     I connected with [name] on 11/12/19 at  2:00 PM EDT by a video enabled telemedicine application and verified that I am speaking with the correct person using two identifiers.   I discussed the limitations of evaluation and management by telemedicine and the availability of in person appointments. The patient expressed understanding and agreed to proceed. Patient location home Provider location home office  History of Present Illness:  24 years old Single AA female lives by herself works Scientist, physiological at [employer].  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  Consequences of Substance Abuse: NA  Past Medical History:  Past Medical History:  Diagnosis Date  . ADHD (attention deficit hyperactivity disorder)   . Anxiety   . Depression   . Pancreatitis 2012    Past Surgical History:  Procedure Laterality Date  . CHOLECYSTECTOMY  2012  . HERNIA REPAIR  2001    Family Psychiatric History: mom and brother has anxiety  Family History:  Family History  Problem Relation Age of Onset  . Anxiety disorder Mother   . Anxiety disorder Maternal Grandmother     Social History:   Social History   Socioeconomic History  . Marital status: Single    Spouse name: Not on file  . Number of children: Not on file  . Years of education: Not on file  . Highest education level: Not on file  Occupational History  . Not on file  Tobacco Use  . Smoking status: Never Smoker  . Smokeless tobacco: Never Used  Vaping Use  . Vaping Use: Never assessed  Substance and Sexual Activity  . Alcohol use: Yes    Comment: social  . Drug use: Never  . Sexual activity: Yes    Partners: Male    Birth control/protection: Injection  Other Topics Concern  . Not on file  Social History Narrative  . Not on file   Social Determinants of Health  Financial Resource Strain:   . Difficulty of Paying Living Expenses: Not on file  Food Insecurity:   . Worried About Programme researcher, broadcasting/film/video in the Last Year: Not on file  . Ran Out of Food in the Last Year: Not on file  Transportation Needs:   . Lack of Transportation (Medical): Not on file  . Lack of Transportation (Non-Medical): Not on file  Physical Activity:   . Days of Exercise per Week: Not on file  . Minutes of Exercise per Session: Not on file  Stress:   . Feeling of Stress : Not on file  Social Connections:   . Frequency of Communication  with Friends and Family: Not on file  . Frequency of Social Gatherings with Friends and Family: Not on file  . Attends Religious Services: Not on file  . Active Member of Clubs or Organizations: Not on file  . Attends Banker Meetings: Not on file  . Marital Status: Not on file    Additional Social History: grew up with mom and step dad, suffered from ADD and struggled in school till she got diagnosed, no other trauma   Allergies:  No Known Allergies  Metabolic Disorder Labs: No results found for: HGBA1C, MPG No results found for: PROLACTIN No results found for: CHOL, TRIG, HDL, CHOLHDL, VLDL, LDLCALC No results found for: TSH  Therapeutic Level Labs: No results found for: LITHIUM No results found for: CBMZ No results found for: VALPROATE  Current Medications: Current Outpatient Medications  Medication Sig Dispense Refill  . escitalopram (LEXAPRO) 10 MG tablet Take 1 tablet (10 mg total) by mouth daily. 30 tablet 0  . ibuprofen (ADVIL) 800 MG tablet Take 1 tablet (800 mg total) by mouth 3 (three) times daily. 21 tablet 0  . JUNEL 1/20 1-20 MG-MCG tablet Take 1 tablet by mouth daily.  5  . medroxyPROGESTERone Acetate (DEPO-PROVERA IM) Inject into the muscle.    . nitrofurantoin, macrocrystal-monohydrate, (MACROBID) 100 MG capsule Take 1 capsule (100 mg total) by mouth 2 (two) times daily. 10 capsule 0  . ondansetron (ZOFRAN ODT) 4 MG disintegrating tablet Take 1 tablet (4 mg total) by mouth every 8 (eight) hours as needed for nausea or vomiting. 20 tablet 0   No current facility-administered medications for this visit.     Psychiatric Specialty Exam: Review of Systems  There were no vitals taken for this visit.There is no height or weight on file to calculate BMI.  General Appearance: Casual  Eye Contact:  Fair  Speech:  Normal Rate  Volume:  Normal  Mood:  subdued  Affect:  Congruent  Thought Process:  Goal Directed  Orientation:  Full (Time, Place, and  Person)  Thought Content:  Rumination  Suicidal Thoughts:  No  Homicidal Thoughts:  No  Memory:  Immediate;   Fair Recent;   Fair  Judgement:  Fair  Insight:  Fair  Psychomotor Activity:  Decreased  Concentration:  Concentration: Fair and Attention Span: Fair  Recall:  [employer] of Knowledge:Fair  Language: Fair  Akathisia:  No  Handed:    AIMS (if indicated):  not done  Assets:  Desire for Improvement Physical Health  ADL's:  Intact  Cognition: WNL  Sleep:  Fair   Screenings: PHQ2-9     Office Visit from 07/17/2016 in Primary Care at [facility] Visit from 07/07/2016 in Primary Care at [facility] Total Score 0 0      Assessment and Plan: as follows  MDD recurrent moderate: start lexapro 5mg  increase to full 10mg  in one week  GAD: start lexapro as above, continue therapy Discussed options if job stressful to keep other options available Work on breathing techniques for anxiety attacks Fu 4 weeks or earlier if needed I discussed the assessment and treatment plan with the patient. The patient was provided an opportunity to ask questions and all were answered. The patient agreed with the plan and demonstrated an understanding of the instructions.   The patient was advised to call back or seek an in-person evaluation if the symptoms worsen or if the condition fails to improve as anticipated.  I provided 40  minutes of non-face-to-face time during this encounter.  , MD 8/23/20212:20 PM

## 2019-11-22 ENCOUNTER — Ambulatory Visit
Admission: EM | Admit: 2019-11-22 | Discharge: 2019-11-22 | Disposition: A | Discharge status: Home / Self Care | Payer: BC Managed Care – PPO | Attending: Emergency Medicine | Admitting: Emergency Medicine

## 2019-11-22 ENCOUNTER — Ambulatory Visit: Payer: Self-pay

## 2019-11-22 ENCOUNTER — Encounter: Payer: Self-pay | Admitting: Emergency Medicine

## 2019-11-22 ENCOUNTER — Other Ambulatory Visit: Payer: Self-pay

## 2019-11-22 ENCOUNTER — Ambulatory Visit (HOSPITAL_COMMUNITY)
Admission: EM | Admit: 2019-11-22 | Discharge: 2019-11-22 | Discharge status: Against Medical Advice | Payer: BC Managed Care – PPO

## 2019-11-22 DIAGNOSIS — N898 Other specified noninflammatory disorders of vagina: Secondary | ICD-10-CM | POA: Diagnosis present

## 2019-11-22 DIAGNOSIS — B373 Candidiasis of vulva and vagina: Secondary | ICD-10-CM | POA: Insufficient documentation

## 2019-11-22 DIAGNOSIS — Z79899 Other long term (current) drug therapy: Secondary | ICD-10-CM | POA: Insufficient documentation

## 2019-11-22 DIAGNOSIS — Z113 Encounter for screening for infections with a predominantly sexual mode of transmission: Secondary | ICD-10-CM | POA: Diagnosis not present

## 2019-11-22 DIAGNOSIS — B3731 Acute candidiasis of vulva and vagina: Secondary | ICD-10-CM

## 2019-11-22 LAB — POCT URINALYSIS DIP (MANUAL ENTRY)
Bilirubin, UA: NEGATIVE
Blood, UA: NEGATIVE
Glucose, UA: NEGATIVE mg/dL
Nitrite, UA: NEGATIVE
Spec Grav, UA: 1.02 (ref 1.010–1.025)
Urobilinogen, UA: 1 E.U./dL
pH, UA: 7.5 (ref 5.0–8.0)

## 2019-11-22 LAB — POCT URINE PREGNANCY: Preg Test, Ur: NEGATIVE

## 2019-11-22 MED ORDER — FLUCONAZOLE 200 MG PO TABS: 200.0000 mg | ORAL_TABLET | Freq: Once | ORAL | 0 refills | Status: AC

## 2019-11-22 NOTE — ED Provider Notes (Signed)
[facility]   [id number] 11/22/19 Arrival Time: 1850   [id number] DISCHARGE  SUBJECTIVE:  Shannon Holloway is a 24 y.o. female who presented to the urgent care for complaint of vaginal discharge, irritation and burning for the past few days.  She denies a precipitating event, recent sexual encounter or recent antibiotic use.  Patient is sexually active with 1 female partner.  Describes discharge as thick and white.  She has tried OTC medications with without relief.  Denies aggravating factors.  Reports similar symptoms in the past.  She denies fever, chills, nausea, vomiting, abdominal or pelvic pain, urinary symptoms, vaginal itching, vaginal odor, vaginal bleeding, dyspareunia, vaginal rashes or lesions.   Patient's last menstrual period was 11/04/2019. Current birth control method: Compliant with BC:  ROS: As per HPI.  All other pertinent ROS negative.     Past Medical History:  Diagnosis Date   ADHD (attention deficit hyperactivity disorder)    Anxiety    Depression    Pancreatitis 2012   Past Surgical History:  Procedure Laterality Date   CHOLECYSTECTOMY  2012   HERNIA REPAIR  2001   No Known Allergies No current facility-administered medications on file prior to encounter.   Current Outpatient Medications on File Prior to Encounter  Medication Sig Dispense Refill   escitalopram (LEXAPRO) 10 MG tablet Take 1 tablet (10 mg total) by mouth daily. 30 tablet 0   ibuprofen (ADVIL) 800 MG tablet Take 1 tablet (800 mg total) by mouth 3 (three) times daily. 21 tablet 0   JUNEL 1/20 1-20 MG-MCG tablet Take 1 tablet by mouth daily.  5   medroxyPROGESTERone Acetate (DEPO-PROVERA IM) Inject into the muscle.     nitrofurantoin, macrocrystal-monohydrate, (MACROBID) 100 MG capsule Take 1 capsule (100 mg total) by mouth 2 (two) times daily. 10 capsule 0   ondansetron (ZOFRAN ODT) 4 MG disintegrating tablet Take 1 tablet (4 mg total) by mouth every 8 (eight) hours  as needed for nausea or vomiting. 20 tablet 0   [DISCONTINUED] buPROPion (WELLBUTRIN XL) 150 MG 24 hr tablet Take 1 tablet (150 mg total) by mouth daily. (Patient not taking: Reported on 12/21/2018) 30 tablet 0   [DISCONTINUED] promethazine (PHENERGAN) 25 MG tablet Take 1 tablet (25 mg total) by mouth every 6 (six) hours as needed for nausea or vomiting. (Patient not taking: Reported on 12/21/2018) 10 tablet 0   [DISCONTINUED] VYVANSE 20 MG capsule Take 1 capsule (20 mg total) by mouth every morning. (Patient not taking: Reported on 11/24/2018) 30 capsule 0    Social History   Socioeconomic History   Marital status: Single    Spouse name: Not on file   Number of children: Not on file   Years of education: Not on file   Highest education level: Not on file  Occupational History   Not on file  Tobacco Use   Smoking status: Never Smoker   Smokeless tobacco: Never Used  Vaping Use   Vaping Use: Never assessed  Substance and Sexual Activity   Alcohol use: Yes    Comment: social   Drug use: Never   Sexual activity: Yes    Partners: Male    Birth control/protection: Injection  Other Topics Concern   Not on file  Social History Narrative   Not on file   Social Determinants of Health   Financial Resource Strain:    Difficulty of Paying Living Expenses: Not on file  Food Insecurity:    Worried About Programme researcher, broadcasting/film/video in  the Last Year: Not on file   Ran Out of Food in the Last Year: Not on file  Transportation Needs:    Lack of Transportation (Medical): Not on file   Lack of Transportation (Non-Medical): Not on file  Physical Activity:    Days of Exercise per Week: Not on file   Minutes of Exercise per Session: Not on file  Stress:    Feeling of Stress : Not on file  Social Connections:    Frequency of Communication with Friends and Family: Not on file   Frequency of Social Gatherings with Friends and Family: Not on file   Attends Religious Services:  Not on file   Active Member of Clubs or Organizations: Not on file   Attends Banker Meetings: Not on file   Marital Status: Not on file  Intimate Partner Violence:    Fear of Current or Ex-Partner: Not on file   Emotionally Abused: Not on file   Physically Abused: Not on file   Sexually Abused: Not on file   Family History  Problem Relation Age of Onset   Anxiety disorder Mother    Anxiety disorder Maternal Grandmother     OBJECTIVE:  Vitals:   11/22/19 1950  BP: 123/82  Pulse: 71  Resp: 19  Temp: 99.3 F (37.4 C)  TempSrc: Oral  SpO2: 98%     General appearance: Alert, NAD, appears stated age Head: NCAT Throat: lips, mucosa, and tongue normal; teeth and gums normal Lungs: CTA bilaterally without adventitious breath sounds Heart: regular rate and rhythm.  Radial pulses 2+ symmetrical bilaterally Back: no CVA tenderness Abdomen: soft, non-tender; bowel sounds normal; no masses or organomegaly; no guarding or rebound tenderness GU: Cervical self swab obtained Skin: warm and dry Psychological:  Alert and cooperative. Normal mood and affect.  LABS:  Results for orders placed or performed during the hospital encounter of 11/22/19  POCT urinalysis dipstick  Result Value Ref Range   Color, UA yellow yellow   Clarity, UA clear clear   Glucose, UA negative negative mg/dL   Bilirubin, UA negative negative   Ketones, POC UA trace (5) (A) negative mg/dL   Spec Grav, UA 7.564 3.329 - 1.025   Blood, UA negative negative   pH, UA 7.5 5.0 - 8.0   Protein Ur, POC trace (A) negative mg/dL   Urobilinogen, UA 1.0 0.2 or 1.0 E.U./dL   Nitrite, UA Negative Negative   Leukocytes, UA Small (1+) (A) Negative  POCT urine pregnancy  Result Value Ref Range   Preg Test, Ur Negative Negative    Labs Reviewed  POCT URINALYSIS DIP (MANUAL ENTRY) - Abnormal; Notable for the following components:      Result Value   Ketones, POC UA trace (5) (*)    Protein Ur,  POC trace (*)    Leukocytes, UA Small (1+) (*)    All other components within normal limits  URINE CULTURE  POCT URINE PREGNANCY  CERVICOVAGINAL ANCILLARY ONLY    ASSESSMENT & PLAN:  1. Screening for STD (sexually transmitted disease)   2. Vaginal yeast infection     Meds ordered this encounter  Medications   fluconazole (DIFLUCAN) 200 MG tablet    Sig: Take 1 tablet (200 mg total) by mouth once for 1 dose. Take the second dose 72 hours after the first dose if symptom does not resolve    Dispense:  2 tablet    Refill:  0    Pending: Labs Reviewed  POCT  URINALYSIS DIP (MANUAL ENTRY) - Abnormal; Notable for the following components:      Result Value   Ketones, POC UA trace (5) (*)    Protein Ur, POC trace (*)    Leukocytes, UA Small (1+) (*)    All other components within normal limits  URINE CULTURE  POCT URINE PREGNANCY  CERVICOVAGINAL ANCILLARY ONLY   Discharge instructions Vaginal self-swab obtained.  We will follow up with you regarding abnormal results Prescribed diflucan 200 mg once daily and then second dose 72 hours later Take medications as prescribed and to completion If tests results are positive, please abstain from sexual activity until you and your partner(s) have been treated Follow up with PCP or [facility] if symptoms persists Return here or go to ER if you have any new or worsening symptoms fever, chills, nausea, vomiting, abdominal or pelvic pain, painful intercourse, vaginal discharge, vaginal bleeding, persistent symptoms despite treatment, etc...  Reviewed expectations re: course of current medical issues. Questions answered. Outlined signs and symptoms indicating need for more acute intervention. Patient verbalized understanding. After Visit Summary given.    Note: This document was prepared using Dragon voice recognition software and may include unintentional dictation errors.    Durward Parcel, FNP 11/22/19 2017

## 2019-11-22 NOTE — ED Triage Notes (Signed)
Vaginal discharge, irritation, burning with urination and urinary frequency

## 2019-11-22 NOTE — Discharge Instructions (Addendum)
Vaginal self-swab obtained.  We will follow up with you regarding abnormal results Prescribed diflucan 200 mg once daily and then second dose 72 hours later Take medications as prescribed and to completion If tests results are positive, please abstain from sexual activity until you and your partner(s) have been treated Follow up with PCP or [facility] if symptoms persists Return here or go to ER if you have any new or worsening symptoms fever, chills, nausea, vomiting, abdominal or pelvic pain, painful intercourse, vaginal discharge, vaginal bleeding, persistent symptoms despite treatment, etc... 

## 2019-11-25 LAB — URINE CULTURE: Culture: <10000 — AB

## 2019-11-27 LAB — CERVICOVAGINAL ANCILLARY ONLY
Bacterial Vaginitis (gardnerella): POSITIVE — AB
Candida Glabrata: NEGATIVE
Candida Vaginitis: POSITIVE — AB
Chlamydia: NEGATIVE
Comment: NEGATIVE
Comment: NEGATIVE
Comment: NEGATIVE
Comment: NEGATIVE
Comment: NEGATIVE
Comment: NORMAL
Neisseria Gonorrhea: NEGATIVE
Trichomonas: NEGATIVE

## 2019-11-28 ENCOUNTER — Telehealth (HOSPITAL_COMMUNITY): Payer: Self-pay | Admitting: Emergency Medicine

## 2019-11-28 MED ORDER — METRONIDAZOLE 500 MG PO TABS: 500.0000 mg | ORAL_TABLET | Freq: Two times a day (BID) | ORAL | 0 refills | Status: DC

## 2019-12-04 ENCOUNTER — Other Ambulatory Visit: Payer: Self-pay | Admitting: Gastroenterology

## 2019-12-04 ENCOUNTER — Ambulatory Visit
Admission: RE | Admit: 2019-12-04 | Discharge: 2019-12-04 | Disposition: A | Discharge status: Home / Self Care | Payer: BC Managed Care – PPO | Source: Ambulatory Visit | Attending: Gastroenterology | Admitting: Gastroenterology

## 2019-12-04 ENCOUNTER — Other Ambulatory Visit (HOSPITAL_COMMUNITY): Payer: Self-pay | Admitting: Psychiatry

## 2019-12-04 DIAGNOSIS — R1084 Generalized abdominal pain: Secondary | ICD-10-CM

## 2019-12-04 DIAGNOSIS — K861 Other chronic pancreatitis: Secondary | ICD-10-CM

## 2019-12-04 MED ORDER — IOPAMIDOL (ISOVUE-300) INJECTION 61%
100.0000 mL | Freq: Once | INTRAVENOUS | Status: AC | PRN
  Administered 2019-12-04: 100 mL via INTRAVENOUS

## 2019-12-10 ENCOUNTER — Other Ambulatory Visit: Payer: Self-pay | Admitting: Gastroenterology

## 2019-12-10 DIAGNOSIS — K859 Acute pancreatitis without necrosis or infection, unspecified: Secondary | ICD-10-CM

## 2019-12-10 DIAGNOSIS — R1084 Generalized abdominal pain: Secondary | ICD-10-CM

## 2019-12-12 ENCOUNTER — Encounter (HOSPITAL_COMMUNITY): Payer: Self-pay | Admitting: Psychiatry

## 2019-12-12 ENCOUNTER — Telehealth (INDEPENDENT_AMBULATORY_CARE_PROVIDER_SITE_OTHER): Payer: No Typology Code available for payment source | Admitting: Psychiatry

## 2019-12-12 DIAGNOSIS — F411 Generalized anxiety disorder: Secondary | ICD-10-CM | POA: Diagnosis not present

## 2019-12-12 DIAGNOSIS — F331 Major depressive disorder, recurrent, moderate: Secondary | ICD-10-CM

## 2019-12-12 MED ORDER — ESCITALOPRAM OXALATE 10 MG PO TABS
10.0000 mg | ORAL_TABLET | Freq: Every day | ORAL | 0 refills | Status: DC
Start: 2019-12-12 — End: 2020-01-28
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
## 2019-12-29 ENCOUNTER — Other Ambulatory Visit: Payer: Self-pay

## 2020-01-28 ENCOUNTER — Other Ambulatory Visit (HOSPITAL_COMMUNITY): Payer: Self-pay | Admitting: Psychiatry

## 2020-02-06 ENCOUNTER — Encounter (HOSPITAL_COMMUNITY): Payer: Self-pay | Admitting: Psychiatry

## 2020-02-06 ENCOUNTER — Telehealth (INDEPENDENT_AMBULATORY_CARE_PROVIDER_SITE_OTHER): Payer: No Typology Code available for payment source | Admitting: Psychiatry

## 2020-02-06 DIAGNOSIS — F331 Major depressive disorder, recurrent, moderate: Secondary | ICD-10-CM | POA: Diagnosis not present

## 2020-02-06 DIAGNOSIS — F411 Generalized anxiety disorder: Secondary | ICD-10-CM | POA: Diagnosis not present

## 2020-02-06 MED ORDER — ESCITALOPRAM OXALATE 10 MG PO TABS
10.0000 mg | ORAL_TABLET | Freq: Every day | ORAL | 2 refills | Status: DC
Start: 2020-02-06 — End: 2020-02-11
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
## 2020-02-11 ENCOUNTER — Other Ambulatory Visit (HOSPITAL_COMMUNITY): Payer: Self-pay

## 2020-02-11 MED ORDER — ESCITALOPRAM OXALATE 10 MG PO TABS
10.0000 mg | ORAL_TABLET | Freq: Every day | ORAL | 0 refills | Status: DC
Start: 2020-02-11 — End: 2020-08-08

## 2020-04-24 ENCOUNTER — Encounter (HOSPITAL_COMMUNITY): Payer: Self-pay | Admitting: Psychiatry

## 2020-04-24 ENCOUNTER — Telehealth (INDEPENDENT_AMBULATORY_CARE_PROVIDER_SITE_OTHER): Payer: No Typology Code available for payment source | Admitting: Psychiatry

## 2020-04-24 DIAGNOSIS — F411 Generalized anxiety disorder: Secondary | ICD-10-CM | POA: Diagnosis not present

## 2020-04-24 DIAGNOSIS — F331 Major depressive disorder, recurrent, moderate: Secondary | ICD-10-CM
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
## 2020-05-12 ENCOUNTER — Telehealth (HOSPITAL_COMMUNITY): Payer: No Typology Code available for payment source | Admitting: Psychiatry

## 2020-07-25 ENCOUNTER — Telehealth (HOSPITAL_COMMUNITY): Payer: No Typology Code available for payment source | Admitting: Psychiatry

## 2020-08-08 ENCOUNTER — Encounter (HOSPITAL_COMMUNITY): Payer: Self-pay | Admitting: Psychiatry

## 2020-08-08 ENCOUNTER — Telehealth (INDEPENDENT_AMBULATORY_CARE_PROVIDER_SITE_OTHER): Payer: No Typology Code available for payment source | Admitting: Psychiatry

## 2020-08-08 DIAGNOSIS — F331 Major depressive disorder, recurrent, moderate: Secondary | ICD-10-CM

## 2020-08-08 DIAGNOSIS — F411 Generalized anxiety disorder: Secondary | ICD-10-CM | POA: Diagnosis not present

## 2020-08-08 MED ORDER — ESCITALOPRAM OXALATE 10 MG PO TABS
10.0000 mg | ORAL_TABLET | Freq: Every day | ORAL | 0 refills | Status: DC
Start: 2020-08-08 — End: 2021-08-31

## 2020-08-08 NOTE — Progress Notes (Signed)
[facility] Follow up visit   Patient Identification: Shannon Holloway MRN:  [id number] Date of Evaluation:  08/08/2020 Referral Source: primary care and self Chief Complaint:   anxiety follow up  Visit Diagnosis:    ICD-10-CM   1. MDD (major depressive disorder), recurrent episode, moderate (HCC)  F33.1   2. GAD (generalized anxiety disorder)  F41.1      Virtual Visit via Telephone Note  I connected with Haily Fisher-Parrett on 08/08/20 at  1:00 PM EDT by telephone and verified that I am speaking with the correct person using two identifiers.  Location: Patient: home Provider: home office   I discussed the limitations, risks, security and privacy concerns of performing an evaluation and management service by telephone and the availability of in person appointments. I also discussed with the patient that there may be a patient responsible charge related to this service. The patient expressed understanding and agreed to proceed    I discussed the assessment and treatment plan with the patient. The patient was provided an opportunity to ask questions and all were answered. The patient agreed with the plan and demonstrated an understanding of the instructions.   The patient was advised to call back or seek an in-person evaluation if the symptoms worsen or if the condition fails to improve as anticipated.  I provided 11  minutes of non-face-to-face time during this encounter including documentation.    History of Present Illness:  25 years old Single AA female lives by herself works Scientist, physiological at [employer].  Patient has not picked up her medications she has ran low and fifth she was out of money she will ask her mother.  She has been feeling down not suicidal but she is having also job stress she understands to be compliant with medication and will pick it up today it does help when she is taking it  Discussed option to involved in therapy also to call us back earlier that she has  picked of the medication and that she is taking it Aggravating factor: Job stress, finances Modifying factor: pets, mom , brother  Duration since 2021   No suicidal thoughts, in past had been cutter when young no such incident recently      Past Psychiatric History: anxiety , depression, ADHD  Previous Psychotropic Medications: Yes   Substance Abuse History in the last 12 months:  No.  Consequences of Substance Abuse: NA  Past Medical History:  Past Medical History:  Diagnosis Date  . ADHD (attention deficit hyperactivity disorder)   . Anxiety   . Depression   . Pancreatitis 2012    Past Surgical History:  Procedure Laterality Date  . CHOLECYSTECTOMY  2012  . HERNIA REPAIR  2001    Family Psychiatric History: mom and brother has anxiety  Family History:  Family History  Problem Relation Age of Onset  . Anxiety disorder Mother   . Anxiety disorder Maternal Grandmother     Social History:   Social History   Socioeconomic History  . Marital status: Single    Spouse name: Not on file  . Number of children: Not on file  . Years of education: Not on file  . Highest education level: Not on file  Occupational History  . Not on file  Tobacco Use  . Smoking status: Never Smoker  . Smokeless tobacco: Never Used  Vaping Use  . Vaping Use: Not on file  Substance and Sexual Activity  . Alcohol use: Yes    Comment: social  .  Drug use: Never  . Sexual activity: Yes    Partners: Male    Birth control/protection: Injection  Other Topics Concern  . Not on file  Social History Narrative  . Not on file   Social Determinants of Health   Financial Resource Strain: Not on file  Food Insecurity: Not on file  Transportation Needs: Not on file  Physical Activity: Not on file  Stress: Not on file  Social Connections: Not on file     Allergies:  No Known Allergies  Metabolic Disorder Labs: No results found for: HGBA1C, MPG No results found for: PROLACTIN No  results found for: CHOL, TRIG, HDL, CHOLHDL, VLDL, LDLCALC No results found for: TSH  Therapeutic Level Labs: No results found for: LITHIUM No results found for: CBMZ No results found for: VALPROATE  Current Medications: Current Outpatient Medications  Medication Sig Dispense Refill  . escitalopram (LEXAPRO) 10 MG tablet Take 1 tablet (10 mg total) by mouth daily. 30 tablet 0  . ibuprofen (ADVIL) 800 MG tablet Take 1 tablet (800 mg total) by mouth 3 (three) times daily. 21 tablet 0  . JUNEL 1/20 1-20 MG-MCG tablet Take 1 tablet by mouth daily.  5  . medroxyPROGESTERone Acetate (DEPO-PROVERA IM) Inject into the muscle.    . metroNIDAZOLE (FLAGYL) 500 MG tablet Take 1 tablet (500 mg total) by mouth 2 (two) times daily. 14 tablet 0  . nitrofurantoin, macrocrystal-monohydrate, (MACROBID) 100 MG capsule Take 1 capsule (100 mg total) by mouth 2 (two) times daily. 10 capsule 0  . ondansetron (ZOFRAN ODT) 4 MG disintegrating tablet Take 1 tablet (4 mg total) by mouth every 8 (eight) hours as needed for nausea or vomiting. 20 tablet 0   No current facility-administered medications for this visit.     Psychiatric Specialty Exam: Review of Systems  Cardiovascular: Negative for chest pain.  Psychiatric/Behavioral: Negative for self-injury. The patient is not nervous/anxious.     There were no vitals taken for this visit.There is no height or weight on file to calculate BMI.  General Appearance:   Eye Contact:    Speech:  Normal Rate  Volume:  Normal  Mood: Subdued  Affect:  Congruent  Thought Process:  Goal Directed  Orientation:  Full (Time, Place, and Person)  Thought Content:  Rumination  Suicidal Thoughts:  No  Homicidal Thoughts:  No  Memory:  Immediate;   Fair Recent;   Fair  Judgement:  Fair  Insight:  Fair  Psychomotor Activity:  Decreased  Concentration:  Concentration: Fair and Attention Span: Fair  Recall:  [employer] of Knowledge:Fair  Language: Fair  Akathisia:  No   Handed:    AIMS (if indicated):  not done  Assets:  Desire for Improvement Physical Health  ADL's:  Intact  Cognition: WNL  Sleep:  Fair   Screenings: PHQ2-9   Flowsheet Row Office Visit from 07/17/2016 in [facility] at [facility] Visit from 07/07/2016 in Primary Care at [facility] Total Score 0 0    Flowsheet Row Video Visit from 08/08/2020 [facility] [place]  C-SSRS RISK CATEGORY No Risk     Prior documentation reviewed  Assessment and Plan: as follows  MDD recurrent moderate: subdued she has not been taking her medication she will pick it up today she is feeling down and understands the medication compliance is needed and she will pick it up and start we will send refill it does help her when she is taking it  GAD: Job stress is making her anxious she will start Lexapro again today that has helped  Discussed options if job stressful to keep other options available   Follow-up in 1 month or earlier if needed call us back within the week that she has started medication and her status  Thresa Ross, MD 5/20/20221:11 PM

## 2020-08-17 ENCOUNTER — Inpatient Hospital Stay (HOSPITAL_COMMUNITY)
Admission: EM | Admit: 2020-08-17 | Discharge: 2020-08-20 | DRG: 439 | Disposition: A | Discharge status: Home / Self Care | Payer: No Typology Code available for payment source | Attending: Internal Medicine | Admitting: Internal Medicine

## 2020-08-17 ENCOUNTER — Encounter (HOSPITAL_COMMUNITY): Payer: Self-pay | Admitting: Emergency Medicine

## 2020-08-17 ENCOUNTER — Other Ambulatory Visit: Payer: Self-pay

## 2020-08-17 DIAGNOSIS — Z20822 Contact with and (suspected) exposure to covid-19: Secondary | ICD-10-CM | POA: Diagnosis present

## 2020-08-17 DIAGNOSIS — Z6832 Body mass index (BMI) 32.0-32.9, adult: Secondary | ICD-10-CM | POA: Diagnosis not present

## 2020-08-17 DIAGNOSIS — F419 Anxiety disorder, unspecified: Secondary | ICD-10-CM | POA: Diagnosis present

## 2020-08-17 DIAGNOSIS — Z720 Tobacco use: Secondary | ICD-10-CM | POA: Diagnosis not present

## 2020-08-17 DIAGNOSIS — F32A Depression, unspecified: Secondary | ICD-10-CM | POA: Diagnosis present

## 2020-08-17 DIAGNOSIS — K859 Acute pancreatitis without necrosis or infection, unspecified: Secondary | ICD-10-CM | POA: Diagnosis present

## 2020-08-17 DIAGNOSIS — Z9049 Acquired absence of other specified parts of digestive tract: Secondary | ICD-10-CM | POA: Diagnosis not present

## 2020-08-17 DIAGNOSIS — Z793 Long term (current) use of hormonal contraceptives: Secondary | ICD-10-CM | POA: Diagnosis not present

## 2020-08-17 DIAGNOSIS — Z800 Family history of malignant neoplasm of digestive organs: Secondary | ICD-10-CM | POA: Diagnosis not present

## 2020-08-17 DIAGNOSIS — Q453 Other congenital malformations of pancreas and pancreatic duct: Secondary | ICD-10-CM

## 2020-08-17 DIAGNOSIS — F17290 Nicotine dependence, other tobacco product, uncomplicated: Secondary | ICD-10-CM | POA: Diagnosis present

## 2020-08-17 DIAGNOSIS — R197 Diarrhea, unspecified: Secondary | ICD-10-CM | POA: Diagnosis not present

## 2020-08-17 DIAGNOSIS — E669 Obesity, unspecified: Secondary | ICD-10-CM | POA: Diagnosis present

## 2020-08-17 DIAGNOSIS — R748 Abnormal levels of other serum enzymes: Secondary | ICD-10-CM | POA: Diagnosis present

## 2020-08-17 DIAGNOSIS — F909 Attention-deficit hyperactivity disorder, unspecified type: Secondary | ICD-10-CM | POA: Diagnosis present

## 2020-08-17 DIAGNOSIS — Z79899 Other long term (current) drug therapy: Secondary | ICD-10-CM | POA: Diagnosis not present

## 2020-08-17 DIAGNOSIS — E876 Hypokalemia: Secondary | ICD-10-CM | POA: Diagnosis present

## 2020-08-17 DIAGNOSIS — Z818 Family history of other mental and behavioral disorders: Secondary | ICD-10-CM | POA: Diagnosis not present

## 2020-08-17 DIAGNOSIS — K863 Pseudocyst of pancreas: Secondary | ICD-10-CM | POA: Diagnosis present

## 2020-08-17 LAB — COMPREHENSIVE METABOLIC PANEL
ALT: 16 U/L (ref 0–44)
AST: 16 U/L (ref 15–41)
Albumin: 4 g/dL (ref 3.5–5.0)
Alkaline Phosphatase: 66 U/L (ref 38–126)
Anion gap: 10 (ref 5–15)
BUN: 13 mg/dL (ref 6–20)
CO2: 24 mmol/L (ref 22–32)
Calcium: 9 mg/dL (ref 8.9–10.3)
Chloride: 103 mmol/L (ref 98–111)
Creatinine, Ser: 0.78 mg/dL (ref 0.44–1.00)
GFR, Estimated: >60 mL/min (ref >60)
Glucose, Bld: 89 mg/dL (ref 70–99)
Potassium: 3.7 mmol/L (ref 3.5–5.1)
Sodium: 137 mmol/L (ref 135–145)
Total Bilirubin: 0.5 mg/dL (ref 0.3–1.2)
Total Protein: 7.8 g/dL (ref 6.5–8.1)

## 2020-08-17 LAB — CBC
HCT: 38.2 % (ref 36.0–46.0)
Hemoglobin: 12.5 g/dL (ref 12.0–15.0)
MCH: 29.3 pg (ref 26.0–34.0)
MCHC: 32.7 g/dL (ref 30.0–36.0)
MCV: 89.5 fL (ref 80.0–100.0)
Platelets: 279 10*3/uL (ref 150–400)
RBC: 4.27 MIL/uL (ref 3.87–5.11)
RDW: 12.5 % (ref 11.5–15.5)
WBC: 9.1 10*3/uL (ref 4.0–10.5)
nRBC: 0 % (ref 0.0–0.2)

## 2020-08-17 LAB — URINALYSIS, ROUTINE W REFLEX MICROSCOPIC
Bilirubin Urine: NEGATIVE
Glucose, UA: NEGATIVE mg/dL
Hgb urine dipstick: NEGATIVE
Ketones, ur: 20 mg/dL — AB
Leukocytes,Ua: NEGATIVE
Nitrite: NEGATIVE
Protein, ur: NEGATIVE mg/dL
Specific Gravity, Urine: 1.017 (ref 1.005–1.030)
pH: 6 (ref 5.0–8.0)

## 2020-08-17 LAB — I-STAT BETA HCG BLOOD, ED (MC, WL, AP ONLY): I-stat hCG, quantitative: <5 m[IU]/mL (ref <5)

## 2020-08-17 LAB — LIPASE, BLOOD: Lipase: 788 U/L — ABNORMAL HIGH (ref 11–51)

## 2020-08-17 LAB — PHOSPHORUS: Phosphorus: 3.6 mg/dL (ref 2.5–4.6)

## 2020-08-17 LAB — MAGNESIUM: Magnesium: 2.3 mg/dL (ref 1.7–2.4)

## 2020-08-17 MED ORDER — HYDROCODONE-ACETAMINOPHEN 5-325 MG PO TABS
1.0000 | ORAL_TABLET | ORAL | Status: DC | PRN
  Administered 2020-08-18: 1 via ORAL
  Filled 2020-08-17: qty 1

## 2020-08-17 MED ORDER — ONDANSETRON HCL 4 MG/2ML IJ SOLN
4.0000 mg | Freq: Once | INTRAMUSCULAR | Status: AC
  Administered 2020-08-17: 4 mg via INTRAVENOUS
  Filled 2020-08-17: qty 2

## 2020-08-17 MED ORDER — ACETAMINOPHEN 325 MG PO TABS
650.0000 mg | ORAL_TABLET | Freq: Four times a day (QID) | ORAL | Status: DC | PRN
  Administered 2020-08-18: 650 mg via ORAL
  Filled 2020-08-17: qty 2

## 2020-08-17 MED ORDER — HYDROMORPHONE HCL 1 MG/ML IJ SOLN
0.5000 mg | INTRAMUSCULAR | Status: DC | PRN
  Administered 2020-08-18 (×2): 0.5 mg via INTRAVENOUS
  Filled 2020-08-17: qty 0.5
  Filled 2020-08-17: qty 1

## 2020-08-17 MED ORDER — LACTATED RINGERS IV BOLUS
2000.0000 mL | Freq: Once | INTRAVENOUS | Status: AC
  Administered 2020-08-17: 2000 mL via INTRAVENOUS

## 2020-08-17 MED ORDER — LACTATED RINGERS IV SOLN: INTRAVENOUS | Status: DC

## 2020-08-17 MED ORDER — ONDANSETRON 4 MG PO TBDP: 4.0000 mg | ORAL_TABLET | Freq: Once | ORAL | Status: DC | PRN

## 2020-08-17 MED ORDER — ACETAMINOPHEN 650 MG RE SUPP: 650.0000 mg | Freq: Four times a day (QID) | RECTAL | Status: DC | PRN

## 2020-08-17 MED ORDER — HYDROMORPHONE HCL 1 MG/ML IJ SOLN
0.5000 mg | INTRAMUSCULAR | Status: DC | PRN
  Administered 2020-08-17: 0.5 mg via INTRAVENOUS
  Filled 2020-08-17: qty 1

## 2020-08-17 MED ORDER — NICOTINE 14 MG/24HR TD PT24
14.0000 mg | MEDICATED_PATCH | Freq: Every day | TRANSDERMAL | Status: DC
  Administered 2020-08-18 – 2020-08-19 (×3): 14 mg via TRANSDERMAL
  Filled 2020-08-17 (×4): qty 1

## 2020-08-17 MED ORDER — ESCITALOPRAM OXALATE 10 MG PO TABS
10.0000 mg | ORAL_TABLET | Freq: Every day | ORAL | Status: DC
  Administered 2020-08-18 – 2020-08-20 (×3): 10 mg via ORAL
  Filled 2020-08-17 (×3): qty 1

## 2020-08-17 NOTE — ED Triage Notes (Signed)
Patient arrives complaining of pancreatitis. Patient states pain began yesterday, but was too bad to come in. Patient endorses N/V/D.

## 2020-08-17 NOTE — ED Provider Notes (Signed)
[facility]-EMERGENCY DEPT Provider Note   CSN: [id number] Arrival date & time: 08/17/20  1944     History Chief Complaint  Patient presents with  . Pancreatitis    Shannon Holloway is a 25 y.o. female.  HPI   Patient presents to the ED for evaluation of abdominal pain and vomiting.  Patient states she started having symptoms yesterday.  She started having upper abdominal pain that also moved to the right side.  She had multiple episodes of nausea and vomiting.  Patient has had some occasional loose stools.  She has history of pancreatitis related to pancreas divisum.  Patient is followed by [facility] gastroenterology.  She called her doctor today and they suggested ED evaluation.  Past Medical History:  Diagnosis Date  . ADHD (attention deficit hyperactivity disorder)   . Anxiety   . Depression   . Pancreatitis 2012    Patient Active Problem List   Diagnosis Date Noted  . History of panic attacks 05/19/2016  . Major depressive disorder 05/19/2016    Past Surgical History:  Procedure Laterality Date  . CHOLECYSTECTOMY  2012  . HERNIA REPAIR  2001     OB History   No obstetric history on file.     Family History  Problem Relation Age of Onset  . Anxiety disorder Mother   . Anxiety disorder Maternal Grandmother     Social History   Tobacco Use  . Smoking status: Never Smoker  . Smokeless tobacco: Never Used  Substance Use Topics  . Alcohol use: Yes    Comment: social  . Drug use: Never    Home Medications Prior to Admission medications   Medication Sig Start Date End Date Taking? Authorizing Provider  escitalopram (LEXAPRO) 10 MG tablet Take 1 tablet (10 mg total) by mouth daily. 08/08/20   Thresa Ross, MD  ibuprofen (ADVIL) 800 MG tablet Take 1 tablet (800 mg total) by mouth 3 (three) times daily. 12/21/18   Dahlia Byes A, NP  JUNEL 1/20 1-20 MG-MCG tablet Take 1 tablet by mouth daily. 08/08/16   [provider]   medroxyPROGESTERone Acetate (DEPO-PROVERA IM) Inject into the muscle.    [provider]  metroNIDAZOLE (FLAGYL) 500 MG tablet Take 1 tablet (500 mg total) by mouth 2 (two) times daily. 11/28/19   Lamptey, Britta Mccreedy, MD  nitrofurantoin, macrocrystal-monohydrate, (MACROBID) 100 MG capsule Take 1 capsule (100 mg total) by mouth 2 (two) times daily. 04/03/19   Wallis Bamberg, PA-C  ondansetron (ZOFRAN ODT) 4 MG disintegrating tablet Take 1 tablet (4 mg total) by mouth every 8 (eight) hours as needed for nausea or vomiting. 12/21/18   Bast, Gloris Manchester A, NP  buPROPion (WELLBUTRIN XL) 150 MG 24 hr tablet Take 1 tablet (150 mg total) by mouth daily. Patient not taking: Reported on 12/21/2018 02/24/17 12/21/18  Thresa Ross, MD  promethazine (PHENERGAN) 25 MG tablet Take 1 tablet (25 mg total) by mouth every 6 (six) hours as needed for nausea or vomiting. Patient not taking: Reported on 12/21/2018 03/12/17 12/21/18  Lawyer, Cristal Deer, PA-C  VYVANSE 20 MG capsule Take 1 capsule (20 mg total) by mouth every morning. Patient not taking: Reported on 11/24/2018 11/23/16 12/21/18  Thresa Ross, MD    Allergies    Patient has no known allergies.  Review of Systems   Review of Systems  Constitutional: Negative for fever.  Genitourinary: Positive for urgency.  Psychiatric/Behavioral: Negative for confusion.  All other systems reviewed and are negative.   Physical Exam  Updated Vital Signs BP (!) 152/103 (BP Location: Right Arm)   Pulse 68   Temp 98.5 F (36.9 C) (Oral) Comment: 800mg  ibprofen @ 14:30  Resp 20   Ht 1.702 m (5\' 7" )   Wt 93 kg   LMP 08/07/2020   SpO2 99%   BMI 32.11 kg/m   Physical Exam Vitals and nursing note reviewed.  Constitutional:      General: She is not in acute distress.    Appearance: She is well-developed.  HENT:     Head: Normocephalic and atraumatic.     Right Ear: External ear normal.     Left Ear: External ear normal.  Eyes:     General: No scleral icterus.        Right eye: No discharge.        Left eye: No discharge.     Conjunctiva/sclera: Conjunctivae normal.  Neck:     Trachea: No tracheal deviation.  Cardiovascular:     Rate and Rhythm: Normal rate and regular rhythm.  Pulmonary:     Effort: Pulmonary effort is normal. No respiratory distress.     Breath sounds: Normal breath sounds. No stridor. No wheezing or rales.  Abdominal:     General: Bowel sounds are normal. There is no distension.     Palpations: Abdomen is soft.     Tenderness: There is abdominal tenderness in the epigastric area. There is no guarding or rebound.  Musculoskeletal:        General: No tenderness.     Cervical back: Neck supple.  Skin:    General: Skin is warm and dry.     Findings: No rash.  Neurological:     Mental Status: She is alert.     Cranial Nerves: No cranial nerve deficit (no facial droop, extraocular movements intact, no slurred speech).     Sensory: No sensory deficit.     Motor: No abnormal muscle tone or seizure activity.     Coordination: Coordination normal.     ED Results / Procedures / Treatments   Labs (all labs ordered are listed, but only abnormal results are displayed) Labs Reviewed  LIPASE, BLOOD - Abnormal; Notable for the following components:      Result Value   Lipase 788 (*)    All other components within normal limits  URINALYSIS, ROUTINE W REFLEX MICROSCOPIC - Abnormal; Notable for the following components:   APPearance HAZY (*)    Ketones, ur 20 (*)    All other components within normal limits  RESP PANEL BY RT-PCR (FLU A&B, COVID) [id number]  COMPREHENSIVE METABOLIC PANEL  CBC  I-STAT BETA HCG BLOOD, ED ([facility], [facility], [facility] ONLY)     Procedures Procedures   Medications Ordered in ED Medications  HYDROmorphone (DILAUDID) injection 0.5 mg (0.5 mg Intravenous Given 08/17/20 2134)  lactated ringers bolus 2,000 mL (2,000 mLs Intravenous New Bag/Given 08/17/20 2143)  ondansetron (ZOFRAN) injection 4 mg (4 mg Intravenous Given  08/17/20 2134)    ED Course  I have reviewed the triage vital signs and the nursing notes.  Pertinent labs & imaging results that were available during my care of the patient were reviewed by me and considered in my medical decision making (see chart for details).  Clinical Course as of 08/17/20 2213  08/19/20 Aug 17, 2020  2201 Lipase significantly elevated at 788 [JK]    Clinical Course User Index [JK] Wynelle Link, MD   MDM Rules/Calculators/A&P  Patient presented to the ED for evaluation of abdominal pain.  Patient has history of pancreas divisum.  Patient's exam is consistent with acute pancreatitis and her laboratory tests do show significantly elevated lipase.  Patient was treated IV fluids and pain medications.  She is feeling somewhat better.  Previous imaging test did show evidence of marked inflammation of the pancreatic head back in September of last year.  Patient states her GI doctor wanted her to have further testing, possible MRI if she had another episode.  Previous CT scan did recommend MRCP.  Discussed inpatient versus outpatient treatment.  With the patient's persistent pain I will consult with medical service for admission.  She would benefit from GI consultation in the morning and possible further imaging such as MRCP Final Clinical Impression(s) / ED Diagnoses Final diagnoses:  Acute pancreatitis, unspecified complication status, unspecified pancreatitis type    Rx / DC Orders ED Discharge Orders    None       Linwood Dibbles, MD 08/17/20 2213

## 2020-08-17 NOTE — H&P (Signed)
Shannon Holloway JEH:[id number] DOB: 01-01-1996 DOA: 08/17/2020     PCP: Leilani Able, MD   Outpatient Specialists:     GI  Dr.Schooler  Deboraha Sprang,  )    Patient arrived to ER on 08/17/20 at 1944 Referred by Attending Linwood Dibbles, MD   Patient coming from: home     Chief Complaint:    Chief Complaint  Patient presents with  . Pancreatitis    HPI: Shannon Holloway is a 25 y.o. female with medical history significant of recurrent pancreatitis, tobacco abuse   Presented with abdominal pain and vomiting Multiple episodes nausea vomiting a bit of diarrhea. Patient has pancreas divisum Has recurrent pancreatitis or chills recently. Followed usually by gastroenterology. Patient contacted her primary care provider who recommended going to emergency department       Has   been vaccinated against COVID **and boosted   Initial COVID TEST    in house  PCR testing  Pending  Lab Results  Component Value Date   SARSCOV2NAA NOT DETECTED 12/30/2018     Regarding pertinent Chronic problems:   Depression anxiety and ADHD  While in ER: Lipase 788 patient was given Dilaudid and IV fluids     ED Triage Vitals  Enc Vitals Group     BP 08/17/20 1950 (!) 135/106     Pulse Rate 08/17/20 1950 74     Resp 08/17/20 1950 18     Temp 08/17/20 1950 98.5 F (36.9 C)     Temp Source 08/17/20 1950 Oral     SpO2 08/17/20 1950 98 %     Weight 08/17/20 1950 205 lb (93 kg)     Height 08/17/20 1950 5\' 7"  (1.702 m)     Head Circumference --      Peak Flow --      Pain Score 08/17/20 2011 8     Pain Loc --      Pain Edu? --      Excl. in GC? --   TMAX(24)@     _________________________________________ Significant initial  Findings: Abnormal Labs Reviewed  LIPASE, BLOOD - Abnormal; Notable for the following components:      Result Value   Lipase 788 (*)    All other components within normal limits  URINALYSIS, ROUTINE W REFLEX MICROSCOPIC - Abnormal; Notable for the following  components:   APPearance HAZY (*)    Ketones, ur 20 (*)    All other components within normal limits   ____________________________________________ Ordered      The recent clinical data is shown below. Vitals:   08/17/20 1950 08/17/20 2000 08/17/20 2138  BP: (!) 135/106 140/89 (!) 152/103  Pulse: 74 71 68  Resp: 18  20  Temp: 98.5 F (36.9 C)    TempSrc: Oral    SpO2: 98% 97% 99%  Weight: 93 kg    Height: 5\' 7"  (1.702 m)       WBC     Component Value Date/Time   WBC 9.1 08/17/2020 2032        UA   no evidence of UTI      Urine analysis:    Component Value Date/Time   COLORURINE YELLOW 08/17/2020 2020   APPEARANCEUR HAZY (A) 08/17/2020 2020   LABSPEC 1.017 08/17/2020 2020   PHURINE 6.0 08/17/2020 2020   GLUCOSEU NEGATIVE 08/17/2020 2020   HGBUR NEGATIVE 08/17/2020 2020   BILIRUBINUR NEGATIVE 08/17/2020 2020   BILIRUBINUR negative 11/22/2019 2003   KETONESUR 20 (A) 08/17/2020 2020  PROTEINUR NEGATIVE 08/17/2020 2020   UROBILINOGEN 1.0 11/22/2019 2003   UROBILINOGEN 0.2 04/03/2019 1126   NITRITE NEGATIVE 08/17/2020 2020   LEUKOCYTESUR NEGATIVE 08/17/2020 2020    Results for orders placed or performed during the hospital encounter of 11/22/19  Urine culture     Status: Abnormal   Collection Time: 11/22/19  8:03 PM   Specimen: Urine, Random  Result Value Ref Range Status   Specimen Description   Final    URINE, RANDOM Performed at [facility], [address], [place], [place] [address]    Special Requests   Final    NONE Performed at [facility], [address] [place], [place] [address]    Culture (A)  Final    <10,000 COLONIES/mL INSIGNIFICANT GROWTH Performed at [facility], [address] [place], [place] [address]    Report Status 11/25/2019 FINAL  Final    _______________________________________________________ ER Provider Called:  GI   Dr.Buccini  They Recommend admit to medicine  Will see in AM    _______________________________________________ Hospitalist was called for admission for pancreatitis  The following Work up has been ordered so far:  Orders Placed This Encounter  Procedures  . Resp Panel by RT-PCR (Flu A&B, Covid) Nasopharyngeal Swab  . Lipase, blood  . Comprehensive metabolic panel  . CBC  . Urinalysis, Routine w reflex microscopic  . Diet NPO time specified  . Consult to hospitalist  . Airborne and Contact precautions  . I-Stat beta hCG blood, ED    Following Medications were ordered in ER: Medications  HYDROmorphone (DILAUDID) injection 0.5 mg (0.5 mg Intravenous Given 08/17/20 2134)  lactated ringers bolus 2,000 mL (2,000 mLs Intravenous New Bag/Given 08/17/20 2143)  ondansetron (ZOFRAN) injection 4 mg (4 mg Intravenous Given 08/17/20 2134)        Consult Orders  (From admission, onward)         Start     Ordered   08/17/20 2208  Consult to hospitalist  Once       Provider:  (Not yet assigned)  Question Answer Comment  Place call to: [facility]   Reason for Consult Admit      08/17/20 2208            OTHER Significant initial  Findings:  labs showing:    Recent Labs  Lab 08/17/20 2032  NA 137  K 3.7  CO2 24  GLUCOSE 89  BUN 13  CREATININE 0.78  CALCIUM 9.0    Cr  * stable,  Up from baseline see below Lab Results  Component Value Date   CREATININE 0.78 08/17/2020   CREATININE 0.81 12/21/2018   CREATININE 0.92 05/09/2018    Recent Labs  Lab 08/17/20 2032  AST 16  ALT 16  ALKPHOS 66  BILITOT 0.5  PROT 7.8  ALBUMIN 4.0   Lab Results  Component Value Date   CALCIUM 9.0 08/17/2020      Plt: Lab Results  Component Value Date   PLT 279 08/17/2020     COVID-19 Labs  No results for input(s): DDIMER, FERRITIN, LDH, CRP in the last 72 hours.  Lab Results  Component Value Date   SARSCOV2NAA NOT DETECTED 12/30/2018      Recent Labs  Lab 08/17/20 2032  WBC 9.1  HGB 12.5  HCT 38.2  MCV 89.5  PLT 279     HG/HCT   Stable     Component Value Date/Time   HGB 12.5 08/17/2020 2032  HCT 38.2 08/17/2020 2032   MCV 89.5 08/17/2020 2032      Recent Labs  Lab 08/17/20 2032  LIPASE 788*        Cultures:    Component Value Date/Time   SDES  11/22/2019 2003    URINE, RANDOM Performed at [facility], [address] [place], [place] [address]    [facility]  11/22/2019 2003    NONE Performed at [facility], [address], [place], [place] [address]    CULT (A) 11/22/2019 2003    <10,000 COLONIES/mL INSIGNIFICANT GROWTH Performed at [facility], [address], [place], [place] [address]    REPTSTATUS 11/25/2019 FINAL 11/22/2019 2003     Radiological Exams on Admission: No results found. _______________________________________________________________________________________________________ Latest  Blood pressure (!) 152/103, pulse 68, temperature 98.5 F (36.9 C), temperature source Oral, resp. rate 20, height 5\' 7"  (1.702 m), weight 93 kg, last menstrual period 08/07/2020, SpO2 99 %.   Review of Systems:    Pertinent positives include:  abdominal pain, nausea,  Constitutional:  No weight loss, night sweats, Fevers, chills, fatigue, weight loss  HEENT:  No headaches, Difficulty swallowing,Tooth/dental problems,Sore throat,  No sneezing, itching, ear ache, nasal congestion, post nasal drip,  Cardio-vascular:  No chest pain, Orthopnea, PND, anasarca, dizziness, palpitations.no Bilateral lower extremity swelling  GI:  No heartburn, indigestion,  vomiting, diarrhea, change in bowel habits, loss of appetite, melena, blood in stool, hematemesis Resp:  no shortness of breath at rest. No dyspnea on exertion, No excess mucus, no productive cough, No non-productive cough, No coughing up of blood.No change in color of mucus.No wheezing. Skin:  no rash or lesions. No jaundice GU:  no dysuria, change in color of urine, no urgency or frequency. No straining to  urinate.  No flank pain.  Musculoskeletal:  No joint pain or no joint swelling. No decreased range of motion. No back pain.  Psych:  No change in mood or affect. No depression or anxiety. No memory loss.  Neuro: no localizing neurological complaints, no tingling, no weakness, no double vision, no gait abnormality, no slurred speech, no confusion  All systems reviewed and apart from HOPI all are negative _______________________________________________________________________________________________ Past Medical History:   Past Medical History:  Diagnosis Date  . ADHD (attention deficit hyperactivity disorder)   . Anxiety   . Depression   . Pancreatitis 2012      Past Surgical History:  Procedure Laterality Date  . CHOLECYSTECTOMY  2012  . HERNIA REPAIR  2001    Social History:  Ambulatory independently      reports that she has never smoked. She has never used smokeless tobacco. She reports current alcohol use. She reports that she does not use drugs.   Family History:   Family History  Problem Relation Age of Onset  . Anxiety disorder Mother   . Anxiety disorder Maternal Grandmother    ______________________________________________________________________________________________ Allergies: No Known Allergies   Prior to Admission medications   Medication Sig Start Date End Date Taking? Authorizing Provider  escitalopram (LEXAPRO) 10 MG tablet Take 1 tablet (10 mg total) by mouth daily. 08/08/20   08/10/20, MD  ibuprofen (ADVIL) 800 MG tablet Take 1 tablet (800 mg total) by mouth 3 (three) times daily. 12/21/18   02/20/19 A, NP  JUNEL 1/20 1-20 MG-MCG tablet Take 1 tablet by mouth daily. 08/08/16   [provider]  medroxyPROGESTERone Acetate (DEPO-PROVERA IM) Inject into the muscle.    [provider]  metroNIDAZOLE (FLAGYL) 500 MG tablet Take 1 tablet (500 mg total) by mouth 2 (two) times daily. 11/28/19   Lamptey, Britta MccreedyPhilip O, MD   nitrofurantoin, macrocrystal-monohydrate, (MACROBID) 100 MG capsule Take 1 capsule (100 mg total) by mouth 2 (two) times daily. 04/03/19   Wallis BambergMani, Mario, PA-C  ondansetron (ZOFRAN ODT) 4 MG disintegrating tablet Take 1 tablet (4 mg total) by mouth every 8 (eight) hours as needed for nausea or vomiting. 12/21/18   Bast, Gloris Manchesterraci A, NP  buPROPion (WELLBUTRIN XL) 150 MG 24 hr tablet Take 1 tablet (150 mg total) by mouth daily. Patient not taking: Reported on 12/21/2018 02/24/17 12/21/18  Thresa RossAkhtar, Nadeem, MD  promethazine (PHENERGAN) 25 MG tablet Take 1 tablet (25 mg total) by mouth every 6 (six) hours as needed for nausea or vomiting. Patient not taking: Reported on 12/21/2018 03/12/17 12/21/18  Lawyer, Cristal Deerhristopher, PA-C  VYVANSE 20 MG capsule Take 1 capsule (20 mg total) by mouth every morning. Patient not taking: Reported on 11/24/2018 11/23/16 12/21/18  Thresa RossAkhtar, Nadeem, MD    ___________________________________________________________________________________________________ Physical Exam: Vitals with BMI 08/17/2020 08/17/2020 08/17/2020  Height - - 5\' 7"   Weight - - 205 lbs  BMI - - 32.1  Systolic 152 140 [id number]  Diastolic 103 89 106  Pulse 68 71 74     1. General:  in No  Acute distress   well  -appearing 2. Psychological: Alert and  Oriented 3. Head/ENT:    Dry Mucous Membranes                          Head Non traumatic, neck supple                          Normal  Dentition 4. SKIN:   decreased Skin turgor,  Skin clean Dry and intact no rash 5. Heart: Regular rate and rhythm no Murmur, no Rub or gallop 6. Lungs:   no wheezes or crackles   7. Abdomen: Soft, epigastric tender, Non distended bowel sounds present 8. Lower extremities: no clubbing, cyanosis, no edema 9. Neurologically Grossly intact, moving all 4 extremities equally  10. MSK: Normal range of motion    Chart has been  reviewed  ______________________________________________________________________________________________  Assessment/Plan  25 y.o. female with medical history significant of recurrent pancreatitis, tobacco abuse   Admitted for pancreatitis Present on Admission: . Pancreatitis -rehydrate control pain supportive management appreciate [facility] consult May need MRCP in a.m. N.p.o. Repeat lipase in a.m.  . Tobacco abuse Spoke importance of discontinuation of vaping. Nicotine patch as needed  Other plan as per orders.  DVT prophylaxis:  SCD       Code Status:    Code Status: Not on file FULL CODE as per patient    I had personally discussed CODE STATUS with patient    Family Communication:   Family not at  Bedside    Disposition Plan:  To home once workup is complete and patient is stable   Following barriers for discharge:                                                         Pain controlled with PO medications  Will need to be able to tolerate PO                                                      Will need consultants to evaluate patient prior to discharge                       Consults called: [facility]     Admission status:  ED Disposition    ED Disposition Condition Comment   Admit  Hospital Area: [facility] [[id number]]  Level of Care: Med-Surg [16]  May admit patient to [facility] or [facility] if equivalent level of care is available:: No  Covid Evaluation: Asymptomatic Screening Protocol (No Symptoms)  Diagnosis: Pancreatitis [161096]  Admitting Physician: Therisa Doyne [[id number]]  Attending Physician: Therisa Doyne [[id number]]  Estimated length of stay: past midnight tomorrow  Certification:: I certify this patient will need inpatient services for at least 2 midnights         inpatient     I Expect 2 midnight stay secondary to severity of patient's current illness need for  inpatient interventions justified by the following:    Severe lab/radiological/exam abnormalities including:  pancreatitis   That are currently affecting medical management.   I expect  patient to be hospitalized for 2 midnights requiring inpatient medical care.  Patient is at high risk for adverse outcome (such as loss of life or disability) if not treated.  Indication for inpatient stay as follows:    severe pain requiring acute inpatient management,  inability to maintain oral hydration    Need for operative/procedural  intervention    Need for  , IV fluids, IV pain medications,      Level of care      medical floor    Lab Results  Component Value Date   SARSCOV2NAA NOT DETECTED 12/30/2018     Precautions: admitted as asymptomatic screening protocol    PPE: Used by the provider:   N95  eye Goggles,  Gloves     Lamarkus Nebel 08/17/2020, 11:15 PM    [facility]     after 2 AM please page floor coverage PA If 7AM-7PM, please contact the day team taking care of the patient using [email or web address]   Patient was evaluated in the context of the global COVID-19 pandemic, which necessitated consideration that the patient might be at risk for infection with the SARS-CoV-2 virus that causes COVID-19. Institutional protocols and algorithms that pertain to the evaluation of patients at risk for COVID-19 are in a state of rapid change based on information released by regulatory bodies including the CDC and federal and state organizations. These policies and algorithms were followed during the patient's care.

## 2020-08-18 DIAGNOSIS — K859 Acute pancreatitis without necrosis or infection, unspecified: Secondary | ICD-10-CM | POA: Diagnosis not present

## 2020-08-18 LAB — COMPREHENSIVE METABOLIC PANEL
ALT: 13 U/L (ref 0–44)
AST: 14 U/L — ABNORMAL LOW (ref 15–41)
Albumin: 3.3 g/dL — ABNORMAL LOW (ref 3.5–5.0)
Alkaline Phosphatase: 56 U/L (ref 38–126)
Anion gap: 9 (ref 5–15)
BUN: 10 mg/dL (ref 6–20)
CO2: 25 mmol/L (ref 22–32)
Calcium: 8.7 mg/dL — ABNORMAL LOW (ref 8.9–10.3)
Chloride: 104 mmol/L (ref 98–111)
Creatinine, Ser: 0.75 mg/dL (ref 0.44–1.00)
GFR, Estimated: >60 mL/min (ref >60)
Glucose, Bld: 85 mg/dL (ref 70–99)
Potassium: 3.3 mmol/L — ABNORMAL LOW (ref 3.5–5.1)
Sodium: 138 mmol/L (ref 135–145)
Total Bilirubin: 0.5 mg/dL (ref 0.3–1.2)
Total Protein: 6.8 g/dL (ref 6.5–8.1)

## 2020-08-18 LAB — CBC WITH DIFFERENTIAL/PLATELET
Abs Immature Granulocytes: 0.02 10*3/uL (ref 0.00–0.07)
Basophils Absolute: 0 10*3/uL (ref 0.0–0.1)
Basophils Relative: 0 %
Eosinophils Absolute: 0.2 10*3/uL (ref 0.0–0.5)
Eosinophils Relative: 2 %
HCT: 34.7 % — ABNORMAL LOW (ref 36.0–46.0)
Hemoglobin: 11.1 g/dL — ABNORMAL LOW (ref 12.0–15.0)
Immature Granulocytes: 0 %
Lymphocytes Relative: 38 %
Lymphs Abs: 2.7 10*3/uL (ref 0.7–4.0)
MCH: 29.1 pg (ref 26.0–34.0)
MCHC: 32 g/dL (ref 30.0–36.0)
MCV: 91.1 fL (ref 80.0–100.0)
Monocytes Absolute: 0.5 10*3/uL (ref 0.1–1.0)
Monocytes Relative: 7 %
Neutro Abs: 3.8 10*3/uL (ref 1.7–7.7)
Neutrophils Relative %: 53 %
Platelets: 236 10*3/uL (ref 150–400)
RBC: 3.81 MIL/uL — ABNORMAL LOW (ref 3.87–5.11)
RDW: 12.5 % (ref 11.5–15.5)
WBC: 7.2 10*3/uL (ref 4.0–10.5)
nRBC: 0 % (ref 0.0–0.2)

## 2020-08-18 LAB — MAGNESIUM: Magnesium: 2 mg/dL (ref 1.7–2.4)

## 2020-08-18 LAB — TSH: TSH: 1.509 u[IU]/mL (ref 0.350–4.500)

## 2020-08-18 LAB — PHOSPHORUS: Phosphorus: 3 mg/dL (ref 2.5–4.6)

## 2020-08-18 LAB — RESP PANEL BY RT-PCR (FLU A&B, COVID) ARPGX2
Influenza A by PCR: NEGATIVE
Influenza B by PCR: NEGATIVE
SARS Coronavirus 2 by RT PCR: NEGATIVE

## 2020-08-18 LAB — HIV ANTIBODY (ROUTINE TESTING W REFLEX): HIV Screen 4th Generation wRfx: NONREACTIVE

## 2020-08-18 LAB — LIPASE, BLOOD: Lipase: 304 U/L — ABNORMAL HIGH (ref 11–51)

## 2020-08-18 MED ORDER — TRAZODONE HCL 50 MG PO TABS
25.0000 mg | ORAL_TABLET | Freq: Every evening | ORAL | Status: DC | PRN
  Administered 2020-08-19 (×2): 25 mg via ORAL
  Filled 2020-08-18 (×2): qty 1

## 2020-08-18 MED ORDER — ONDANSETRON HCL 4 MG/2ML IJ SOLN
4.0000 mg | Freq: Four times a day (QID) | INTRAMUSCULAR | Status: DC | PRN
  Administered 2020-08-18: 4 mg via INTRAVENOUS

## 2020-08-18 MED ORDER — POTASSIUM CHLORIDE 10 MEQ/100ML IV SOLN
10.0000 meq | INTRAVENOUS | Status: AC
  Administered 2020-08-18 (×3): 10 meq via INTRAVENOUS
  Filled 2020-08-18 (×3): qty 100

## 2020-08-18 MED ORDER — POTASSIUM CHLORIDE 10 MEQ/100ML IV SOLN
INTRAVENOUS | Status: AC
  Administered 2020-08-18: 10 meq via INTRAVENOUS
  Filled 2020-08-18: qty 100

## 2020-08-18 MED ORDER — ONDANSETRON HCL 4 MG/2ML IJ SOLN
INTRAMUSCULAR | Status: AC
  Filled 2020-08-18: qty 2

## 2020-08-18 NOTE — Consult Note (Signed)
Referring Provider: [facility] Primary Care Physician:  Leilani Able, MD Primary Gastroenterologist:  Dr. Doy Mince  Reason for Consultation: Recurrent pancreatitis  HPI: Shannon Holloway is a 25 y.o. female who has been seen as an outpatient by Dr. Doy Mince in our office.    She indicates that she was diagnosed with pancreas divisum about 12 years ago (at age 89) in [place], [place] (outside records not available).  She had her gallbladder out but ultimately was diagnosed with pancreas divisum because of recurrent attacks.  She has had biliary stents in the past, as recently as 2018.  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  She indicates that she has had recurring attacks of pancreatitis on average roughly every 6 months, although the vast majority of these, probably 90%, are treated at home with clear liquid diet and ibuprofen.  She thinks its been several months since her last episode.    With that background, about 48 hours ago the patient developed extremely severe, rather diffuse abdominal pain, mostly in the right mid abdominal area, with very severe nausea and some vomiting.  The intensity of this pain was reminiscent of the intensity when she was first diagnosed 12 years ago.  However, she was babysitting for her younger siblings and was not able to come to the hospital until yesterday, at which time she had a lipase of 788 (has fallen to 304 overnight), completely normal liver chemistries, normal white count, no hemoconcentration.    The patient cannot identify any triggering elements for these episodes, and in particular, there is no history of alcohol.  At this time, she has residual pain, probably 4 on a scale of 10, with significant benefit from her pain medication and antinausea medication here in the hospital.  She still has pain when she takes a deep breath.   Past Medical History:  Diagnosis  Date  . ADHD (attention deficit hyperactivity disorder)   . Anxiety   . Depression   . Pancreatitis 2012    Past Surgical History:  Procedure Laterality Date  . CHOLECYSTECTOMY  2012  . HERNIA REPAIR  2001    Prior to Admission medications   Medication Sig Start Date End Date Taking? Authorizing Provider  escitalopram (LEXAPRO) 10 MG tablet Take 1 tablet (10 mg total) by mouth daily. 08/08/20   Thresa Ross, MD  ibuprofen (ADVIL) 800 MG tablet Take 1 tablet (800 mg total) by mouth 3 (three) times daily. 12/21/18   Dahlia Byes A, NP  JUNEL 1/20 1-20 MG-MCG tablet Take 1 tablet by mouth daily. 08/08/16   [provider]  medroxyPROGESTERone Acetate (DEPO-PROVERA IM) Inject into the muscle.    [provider]  metroNIDAZOLE (FLAGYL) 500 MG tablet Take 1 tablet (500 mg total) by mouth 2 (two) times daily. 11/28/19   Lamptey, Britta Mccreedy, MD  nitrofurantoin, macrocrystal-monohydrate, (MACROBID) 100 MG capsule Take 1 capsule (100 mg total) by mouth 2 (two) times daily. 04/03/19   Wallis Bamberg, PA-C  ondansetron (ZOFRAN ODT) 4 MG disintegrating tablet Take 1 tablet (4 mg total) by mouth every 8 (eight) hours as needed for nausea or vomiting. 12/21/18   Bast, Gloris Manchester A, NP  buPROPion (WELLBUTRIN XL) 150 MG 24 hr tablet Take 1 tablet (150 mg total) by mouth daily. Patient not taking: Reported on 12/21/2018 02/24/17 12/21/18  Thresa Ross, MD  promethazine (PHENERGAN) 25 MG tablet Take 1 tablet (25 mg total) by mouth every  6 (six) hours as needed for nausea or vomiting. Patient not taking: Reported on 12/21/2018 03/12/17 12/21/18  Lawyer, Cristal Deer, PA-C  VYVANSE 20 MG capsule Take 1 capsule (20 mg total) by mouth every morning. Patient not taking: Reported on 11/24/2018 11/23/16 12/21/18  Thresa Ross, MD    Current Facility-Administered Medications  Medication Dose Route Frequency Provider Last Rate Last Admin  . acetaminophen (TYLENOL) tablet 650 mg  650 mg Oral Q6H PRN Therisa Doyne, MD       Or  . acetaminophen (TYLENOL) suppository 650 mg  650 mg Rectal Q6H PRN Doutova, Anastassia, MD      . escitalopram (LEXAPRO) tablet 10 mg  10 mg Oral Daily Doutova, Anastassia, MD      . HYDROcodone-acetaminophen (NORCO/VICODIN) 5-325 MG per tablet 1-2 tablet  1-2 tablet Oral Q4H PRN Doutova, Anastassia, MD      . HYDROmorphone (DILAUDID) injection 0.5 mg  0.5 mg Intravenous Q4H PRN Doutova, Anastassia, MD   0.5 mg at 08/18/20 0556  . lactated ringers infusion   Intravenous Continuous Therisa Doyne, MD 125 mL/hr at 08/18/20 0903 New Bag at 08/18/20 0903  . nicotine (NICODERM CQ - dosed in mg/24 hours) patch 14 mg  14 mg Transdermal Daily Doutova, Anastassia, MD   14 mg at 08/18/20 0018  . ondansetron (ZOFRAN) injection 4 mg  4 mg Intravenous Q6H PRN Burnadette Pop, MD   4 mg at 08/18/20 0800  . potassium chloride 10 mEq in 100 mL IVPB  10 mEq Intravenous Q1 Hr x 4 Adhikari, Amrit, MD 100 mL/hr at 08/18/20 0909 10 mEq at 08/18/20 0909    Allergies as of 08/17/2020  . (No Known Allergies)    Family History  Problem Relation Age of Onset  . Anxiety disorder Mother   . Anxiety disorder Maternal Grandmother     Social History   Socioeconomic History  . Marital status: Single    Spouse name: Not on file  . Number of children: Not on file  . Years of education: Not on file  . Highest education level: Not on file  Occupational History  . Not on file  Tobacco Use  . Smoking status: Never Smoker  . Smokeless tobacco: Never Used  Vaping Use  . Vaping Use: Not on file  Substance and Sexual Activity  . Alcohol use: Yes    Comment: social  . Drug use: Never  . Sexual activity: Yes    Partners: Male    Birth control/protection: Injection  Other Topics Concern  . Not on file  Social History Narrative  . Not on file   Social Determinants of Health   Financial Resource Strain: Not on file  Food Insecurity: Not on file  Transportation Needs: Not on file   Physical Activity: Not on file  Stress: Not on file  Social Connections: Not on file  Intimate Partner Violence: Not on file   ROS:  NEGATIVE except for transient diarrhea which is unusual during one of her attacks.   No CP, SOB, skin rashes, joint swelling, urinary sx (other than some pressure/discomfort with urination)  Physical Exam: Vital signs in last 24 hours: Temp:  [97.5 F (36.4 C)-98.6 F (37 C)] 97.5 F (36.4 C) (05/30 0551) Pulse Rate:  [59-92] 61 (05/30 0551) Resp:  [15-20] 15 (05/30 0551) BP: (134-152)/(75-106) 134/79 (05/30 0551) SpO2:  [97 %-100 %] 100 % (05/30 0551) Weight:  [92.1 kg-93 kg] 92.1 kg (05/30 0152) Last BM Date: 08/17/20 (PTA) General:   Alert,  Well-developed, well-nourished, pleasant and cooperative in NAD Head:  Normocephalic and atraumatic. Eyes:  Sclera clear, no icterus.    Mouth:   No ulcerations or lesions.  Oropharynx pink & moist. Lungs:  Clear throughout to auscultation.   No wheezes, crackles, or rhonchi. No evident respiratory distress. Heart:   Regular rate and rhythm; no murmurs, clicks, rubs,  or gallops. Abdomen:  Soft, minimal subjective tenderness to exam, and nondistended. No masses, hepatosplenomegaly or ventral hernias noted. Quiet bowel sounds, without bruits, guarding, or rebound.   Msk:   Symmetrical without gross deformities. Pulses:  Normal radial pulse is noted. Neurologic:  Alert and coherent;  grossly normal neurologically. Skin:  Intact without significant lesions or rashes. Psych:   Alert and cooperative. Normal mood and affect.  Intake/Output from previous day: 05/29 0701 - 05/30 0700 In: 2000 [IV Piggyback:2000] Out: -  Intake/Output this shift: No intake/output data recorded.  Lab Results: Recent Labs    08/17/20 2032 08/18/20 0712  WBC 9.1 7.2  HGB 12.5 11.1*  HCT 38.2 34.7*  PLT 279 236   BMET Recent Labs    08/17/20 2032 08/18/20 0712  NA 137 138  K 3.7 3.3*  CL 103 104  CO2 24 25  GLUCOSE  89 85  BUN 13 10  CREATININE 0.78 0.75  CALCIUM 9.0 8.7*   LFT Recent Labs    08/18/20 0712  PROT 6.8  ALBUMIN 3.3*  AST 14*  ALT 13  ALKPHOS 56  BILITOT 0.5   PT/INR No results for input(s): LABPROT, INR in the last 72 hours.  Studies/Results: No results found.  Impression: 1.  Acute pancreatitis, clinically and biochemically fairly mild 2.  History of recurrent pancreatitis, reported history of pancreas divisum  Plan: 1.  Supportive care with IV fluids and symptomatic management. 2.  MRI of abdomen with MRCP to assess severity of current pancreatitis and to try to define whether she truly has pancreas divisum as reported 3.  Will not advance diet yet, but hopefully can do so tomorrow, at least to clear liquids, if her lipase shows further improvement 4.  Check lipid panel and IgG 4 for alternative etiologies of pancreatitis.   LOS: 1 day   Katy Fitch Harlan Vinal  08/18/2020, 9:09 AM   Pager [phone] If no answer or after 5 PM call [phone]

## 2020-08-18 NOTE — Progress Notes (Signed)
PROGRESS NOTE    Shannon Holloway  DDU:[id number] DOB: September 10, 1995 DOA: 08/17/2020 PCP: Leilani Able, MD   Chief Complain: Abdominal pain  Brief Narrative: Patient is a 25 year old female with history of recurrent pancreatitis, tobacco abuse who presented to the emergency department with complaints of abdominal, vomiting.  She has history of pancreatic divisum and follows with GI.  Lab work on presentation showed elevated lipase.  GI consulted,  plan for MRCP.  Assessment & Plan:   Active Problems:   Pancreatitis   Tobacco abuse   Acute pancreatitis: Presented with abdomen pain, nausea and vomiting.  Has history of recurrent pancreatitis.  History of pancreatitis and follows with GI.  Lipase was elevated on presentation,improving.  Continue IV fluids, continue nothing by mouth.  [facility] consulted.  Plan for MRCP today.  Hypokalemia: Being supplemented with potassium.  Continue IV fluids  Tobacco abuse: Counseled for cessation.  Nicotine patch ordered.         DVT prophylaxis:SCD Code Status: Full Family Communication: None at bedside Status is: Inpatient  Remains inpatient appropriate because:Inpatient level of care appropriate due to severity of illness   Dispo: The patient is from: Home              Anticipated d/c is to: Home              Patient currently is not medically stable to d/c.   Difficult to place patient No     Consultants: GI  Procedures:None  Antimicrobials:  Anti-infectives (From admission, onward)   None      Subjective:  Patient seen and examined the bedside this morning.  Hemodynamically stable.  Comfortable.  Abdomen pain has significantly improved today.  No nausea or vomiting this morning.  Waiting for MRCP.  Objective: Vitals:   08/18/20 0100 08/18/20 0152 08/18/20 0551 08/18/20 0942  BP: (!) 137/94 (!) 146/75 134/79 135/87  Pulse: 92 80 61 (!) 58  Resp: 17 18 15 18   Temp: 98.5 F (36.9 C) 98.5 F (36.9 C) (!) 97.5 F  (36.4 C) 98.2 F (36.8 C)  TempSrc: Oral     SpO2: 98% 100% 100% 99%  Weight:  92.1 kg    Height:  5\' 7"  (1.702 m)      Intake/Output Summary (Last 24 hours) at 08/18/2020 0947 Last data filed at 08/17/2020 2330 Gross per 24 hour  Intake 2000 ml  Output --  Net 2000 ml   Filed Weights   08/17/20 1950 08/18/20 0152  Weight: 93 kg 92.1 kg    Examination:  General exam: Appears calm and comfortable ,Not in distress,obese HEENT:PERRL,Oral mucosa moist, Ear/Nose normal on gross exam Respiratory system: Bilateral equal air entry, normal vesicular breath sounds, no wheezes or crackles  Cardiovascular system: S1 & S2 heard, RRR. No JVD, murmurs, rubs, gallops or clicks. No pedal edema. Gastrointestinal system: Abdomen is nondistended, soft and is mildly tender on epigastric region on deep palpation.. No organomegaly or masses felt. Normal bowel sounds heard. Central nervous system: Alert and oriented. No focal neurological deficits. Extremities: No edema, no clubbing ,no cyanosis, distal peripheral pulses palpable. Skin: No rashes, lesions or ulcers,no icterus ,no pallor   Data Reviewed: I have personally reviewed following labs and imaging studies  CBC: Recent Labs  Lab 08/17/20 2032 08/18/20 0712  WBC 9.1 7.2  NEUTROABS  --  3.8  HGB 12.5 11.1*  HCT 38.2 34.7*  MCV 89.5 91.1  PLT 279 236   Basic Metabolic Panel: Recent Labs  Lab  08/17/20 2031 08/17/20 2032 08/18/20 0712  NA  --  137 138  K  --  3.7 3.3*  CL  --  103 104  CO2  --  24 25  GLUCOSE  --  89 85  BUN  --  13 10  CREATININE  --  0.78 0.75  CALCIUM  --  9.0 8.7*  MG 2.3  --  2.0  PHOS 3.6  --  3.0   GFR: Estimated Creatinine Clearance: 127.4 mL/min (by C-G formula based on SCr of 0.75 mg/dL). Liver Function Tests: Recent Labs  Lab 08/17/20 2032 08/18/20 0712  AST 16 14*  ALT 16 13  ALKPHOS 66 56  BILITOT 0.5 0.5  PROT 7.8 6.8  ALBUMIN 4.0 3.3*   Recent Labs  Lab 08/17/20 2032  08/18/20 0712  LIPASE 788* 304*   No results for input(s): AMMONIA in the last 168 hours. Coagulation Profile: No results for input(s): INR, PROTIME in the last 168 hours. Cardiac Enzymes: No results for input(s): CKTOTAL, CKMB, CKMBINDEX, TROPONINI in the last 168 hours. BNP (last 3 results) No results for input(s): PROBNP in the last 8760 hours. HbA1C: No results for input(s): HGBA1C in the last 72 hours. CBG: No results for input(s): GLUCAP in the last 168 hours. Lipid Profile: No results for input(s): CHOL, HDL, LDLCALC, TRIG, CHOLHDL, LDLDIRECT in the last 72 hours. Thyroid Function Tests: Recent Labs    08/18/20 0712  TSH 1.509   Anemia Panel: No results for input(s): VITAMINB12, FOLATE, FERRITIN, TIBC, IRON, RETICCTPCT in the last 72 hours. Sepsis Labs: No results for input(s): PROCALCITON, LATICACIDVEN in the last 168 hours.  Recent Results (from the past 240 hour(s))  Resp Panel by RT-PCR (Flu A&B, Covid) Nasopharyngeal Swab     Status: None   Collection Time: 08/17/20 10:53 PM   Specimen: Nasopharyngeal Swab; Nasopharyngeal(NP) swabs in vial transport medium  Result Value Ref Range Status   SARS Coronavirus 2 by RT PCR NEGATIVE NEGATIVE Final    Comment: (NOTE) SARS-CoV-2 target nucleic acids are NOT DETECTED.  The SARS-CoV-2 RNA is generally detectable in upper respiratory specimens during the acute phase of infection. The lowest concentration of SARS-CoV-2 viral copies this assay can detect is 138 copies/mL. A negative result does not preclude SARS-Cov-2 infection and should not be used as the sole basis for treatment or other patient management decisions. A negative result may occur with  improper specimen collection/handling, submission of specimen other than nasopharyngeal swab, presence of viral mutation(s) within the areas targeted by this assay, and inadequate number of viral copies(<138 copies/mL). A negative result must be combined with clinical  observations, patient history, and epidemiological information. The expected result is Negative.  Fact Sheet for Patients:  BloggerCourse.com  Fact Sheet for Healthcare Providers:  SeriousBroker.it  This test is no t yet approved or cleared by the [place] FDA and  has been authorized for detection and/or diagnosis of SARS-CoV-2 by FDA under an Emergency Use Authorization (EUA). This EUA will remain  in effect (meaning this test can be used) for the duration of the COVID-19 declaration under Section 564(b)(1) of the Act, 21 U.S.C.section 360bbb-3(b)(1), unless the authorization is terminated  or revoked sooner.       Influenza A by PCR NEGATIVE NEGATIVE Final   Influenza B by PCR NEGATIVE NEGATIVE Final    Comment: (NOTE) The Xpert Xpress SARS-CoV-2/FLU/RSV plus assay is intended as an aid in the diagnosis of influenza from Nasopharyngeal swab specimens and should not be  used as a sole basis for treatment. Nasal washings and aspirates are unacceptable for Xpert Xpress SARS-CoV-2/FLU/RSV testing.  Fact Sheet for Patients: BloggerCourse.com  Fact Sheet for Healthcare Providers: SeriousBroker.it  This test is not yet approved or cleared by the [place] FDA and has been authorized for detection and/or diagnosis of SARS-CoV-2 by FDA under an Emergency Use Authorization (EUA). This EUA will remain in effect (meaning this test can be used) for the duration of the COVID-19 declaration under Section 564(b)(1) of the Act, 21 U.S.C. section 360bbb-3(b)(1), unless the authorization is terminated or revoked.  Performed at [facility], [address], [place], [place] [address]          Radiology Studies: No results found.      Scheduled Meds: . escitalopram  10 mg Oral Daily  . nicotine  14 mg Transdermal Daily   Continuous Infusions: .  lactated ringers 125 mL/hr at 08/18/20 0903  . potassium chloride 10 mEq (08/18/20 0909)     LOS: 1 day    Time spent: More than 50% of that time was spent in counseling and/or coordination of care.      Burnadette Pop, MD [facility] P5/30/2022, 9:47 AM

## 2020-08-18 NOTE — ED Notes (Signed)
ED TO INPATIENT HANDOFF REPORT  ED Nurse Name and Phone #: Shannon CarwinFelicia Baneen Wieseler, RN [phone]  S Name/Age/Gender Shannon Holloway [age] y.o. female Room/Bed: WA18/WA18  Code Status   Code Status: Full Code  Home/SNF/Other Home Patient oriented to: self, place, time and situation Is this baseline? Yes   Triage Complete: Triage complete  Chief Complaint Pancreatitis [K85.90]  Triage Note Patient arrives complaining of pancreatitis. Patient states pain began yesterday, but was too bad to come in. Patient endorses N/V/D.     Allergies No Known Allergies  Level of Care/Admitting Diagnosis ED Disposition    ED Disposition Condition Comment   Admit  [facility] [[id number]]  Level of Care: Med-Surg [16]  May [facility] to [facility] or [facility] if equivalent level of care is available:: No  Covid Evaluation: Asymptomatic Screening Protocol (No Symptoms)  Diagnosis: Pancreatitis [454098][[id number]]  Admitting Physician: Therisa DoyneUTOVA, ANASTASSIA [[id number]]  Attending Physician: Therisa DoyneUTOVA, ANASTASSIA [[id number]]  Estimated length of stay: past midnight tomorrow  Certification:: I certify this patient will need inpatient services for at least 2 midnights       B Medical/Surgery History Past Medical History:  Diagnosis Date  . ADHD (attention deficit hyperactivity disorder)   . Anxiety   . Depression   . Pancreatitis 2012   Past Surgical History:  Procedure Laterality Date  . CHOLECYSTECTOMY  2012  . HERNIA REPAIR  2001     A IV Location/Drains/Wounds Patient Lines/Drains/Airways Status    Active Line/Drains/Airways    Name Placement date Placement time Site Days   Peripheral IV 08/17/20 22 G Left Wrist 08/17/20  2133  Wrist  1          Intake/Output Last 24 hours  Intake/Output Summary (Last 24 hours) at 08/18/2020 0101 Last data filed at 08/17/2020 2330 Gross per 24 hour  Intake 2000 ml  Output --  Net 2000 ml    Labs/Imaging Results for orders  placed or performed during the hospital encounter of 08/17/20 (from the past 48 hour(s))  Urinalysis, Routine w reflex microscopic     Status: Abnormal   Collection Time: 08/17/20  8:20 PM  Result Value Ref Range   Color, Urine YELLOW YELLOW   APPearance HAZY (A) CLEAR   Specific Gravity, Urine 1.017 1.005 - 1.030   pH 6.0 5.0 - 8.0   Glucose, UA NEGATIVE NEGATIVE mg/dL   Hgb urine dipstick NEGATIVE NEGATIVE   Bilirubin Urine NEGATIVE NEGATIVE   Ketones, ur 20 (A) NEGATIVE mg/dL   Protein, ur NEGATIVE NEGATIVE mg/dL   Nitrite NEGATIVE NEGATIVE   Leukocytes,Ua NEGATIVE NEGATIVE    Comment: Performed at [facility], [address] [place], [place] [address]  Magnesium     Status: None   Collection Time: 08/17/20  8:31 PM  Result Value Ref Range   Magnesium 2.3 1.7 - 2.4 mg/dL    Comment: Performed at [facility], [address] [place], [place] [address]  Phosphorus     Status: None   Collection Time: 08/17/20  8:31 PM  Result Value Ref Range   Phosphorus 3.6 2.5 - 4.6 mg/dL    Comment: Performed at [facility], [address] [place], [place] [address]  Lipase, blood     Status: Abnormal   Collection Time: 08/17/20  8:32 PM  Result Value Ref Range   Lipase 788 (H) 11 - 51 U/L    Comment: RESULTS CONFIRMED BY MANUAL DILUTION Performed at [facility], [address], [place], [place] [address]   Comprehensive metabolic panel     Status: None   Collection Time: 08/17/20  8:32 PM  Result Value Ref Range   Sodium 137 135 - 145 mmol/L   Potassium 3.7 3.5 - 5.1 mmol/L   Chloride 103 98 - 111 mmol/L   CO2 24 22 - 32 mmol/L   Glucose, Bld 89 70 - 99 mg/dL    Comment: Glucose reference range applies only to samples taken after fasting for at least 8 hours.   BUN 13 6 - 20 mg/dL   Creatinine, Ser 8.84 0.44 - 1.00 mg/dL   Calcium 9.0 8.9 - 16.6 mg/dL   Total Protein 7.8 6.5 - 8.1 g/dL   Albumin 4.0 3.5  - 5.0 g/dL   AST 16 15 - 41 U/L   ALT 16 0 - 44 U/L   Alkaline Phosphatase 66 38 - 126 U/L   Total Bilirubin 0.5 0.3 - 1.2 mg/dL   GFR, Estimated >06 >30 mL/min    Comment: (NOTE) Calculated using the CKD-EPI Creatinine Equation (2021)    Anion gap 10 5 - 15    Comment: Performed at [facility], [address] [place], [place] [address]  CBC     Status: None   Collection Time: 08/17/20  8:32 PM  Result Value Ref Range   WBC 9.1 4.0 - 10.5 K/uL   RBC 4.27 3.87 - 5.11 MIL/uL   Hemoglobin 12.5 12.0 - 15.0 g/dL   HCT 93.2 35.5 - 73.2 %   MCV 89.5 80.0 - 100.0 fL   MCH 29.3 26.0 - 34.0 pg   MCHC 32.7 30.0 - 36.0 g/dL   RDW 20.2 54.2 - 70.6 %   Platelets 279 150 - 400 K/uL   nRBC 0.0 0.0 - 0.2 %    Comment: Performed at [facility], [address] [place], [place] [address]  I-Stat beta hCG blood, ED     Status: None   Collection Time: 08/17/20  8:37 PM  Result Value Ref Range   I-stat hCG, quantitative <5.0 <5 mIU/mL   Comment 3            Comment:   GEST. AGE      CONC.  (mIU/mL)   <=1 WEEK        5 - 50     2 WEEKS       50 - 500     3 WEEKS       100 - 10,000     4 WEEKS     1,000 - 30,000        FEMALE AND NON-PREGNANT FEMALE:     LESS THAN 5 mIU/mL   Resp Panel by RT-PCR (Flu A&B, Covid) Nasopharyngeal Swab     Status: None   Collection Time: 08/17/20 10:53 PM   Specimen: Nasopharyngeal Swab; Nasopharyngeal(NP) swabs in vial transport medium  Result Value Ref Range   SARS Coronavirus 2 by RT PCR NEGATIVE NEGATIVE    Comment: (NOTE) SARS-CoV-2 target nucleic acids are NOT DETECTED.  The SARS-CoV-2 RNA is generally detectable in upper respiratory specimens during the acute phase of infection. The lowest concentration of SARS-CoV-2 viral copies this assay can detect is 138 copies/mL. A negative result does not preclude SARS-Cov-2 infection and should not be used as the sole basis for treatment or other patient management  decisions. A negative result may occur with  improper specimen collection/handling, submission of  specimen other than nasopharyngeal swab, presence of viral mutation(s) within the areas targeted by this assay, and inadequate number of viral copies(<138 copies/mL). A negative result must be combined with clinical observations, patient history, and epidemiological information. The expected result is Negative.  Fact Sheet for Patients:  BloggerCourse.com  Fact Sheet for Healthcare Providers:  SeriousBroker.it  This test is no t yet approved or cleared by the [place] FDA and  has been authorized for detection and/or diagnosis of SARS-CoV-2 by FDA under an Emergency Use Authorization (EUA). This EUA will remain  in effect (meaning this test can be used) for the duration of the COVID-19 declaration under Section 564(b)(1) of the Act, 21 U.S.C.section 360bbb-3(b)(1), unless the authorization is terminated  or revoked sooner.       Influenza A by PCR NEGATIVE NEGATIVE   Influenza B by PCR NEGATIVE NEGATIVE    Comment: (NOTE) The Xpert Xpress SARS-CoV-2/FLU/RSV plus assay is intended as an aid in the diagnosis of influenza from Nasopharyngeal swab specimens and should not be used as a sole basis for treatment. Nasal washings and aspirates are unacceptable for Xpert Xpress SARS-CoV-2/FLU/RSV testing.  Fact Sheet for Patients: BloggerCourse.com  Fact Sheet for Healthcare Providers: SeriousBroker.it  This test is not yet approved or cleared by the [place] FDA and has been authorized for detection and/or diagnosis of SARS-CoV-2 by FDA under an Emergency Use Authorization (EUA). This EUA will remain in effect (meaning this test can be used) for the duration of the COVID-19 declaration under Section 564(b)(1) of the Act, 21 U.S.C. section 360bbb-3(b)(1), unless the authorization  is terminated or revoked.  Performed at [facility], [address], [place], [place] [address]    No results found.  Pending Labs Unresulted Labs (From admission, onward)          Start     Ordered   08/18/20 0500  HIV Antibody (routine testing w rflx)  (HIV Antibody (Routine testing w reflex) panel)  Tomorrow morning,   R        08/17/20 2249   08/18/20 0500  Magnesium  Tomorrow morning,   R        08/17/20 2249   08/18/20 0500  Phosphorus  Tomorrow morning,   R        08/17/20 2249   08/18/20 0500  CBC WITH DIFFERENTIAL  Tomorrow morning,   R        08/17/20 2249   08/18/20 0500  TSH  Tomorrow morning,   R        08/17/20 2249   08/18/20 0500  Comprehensive metabolic panel  Tomorrow morning,   R        08/17/20 2249   08/18/20 0500  Lipase, blood  Tomorrow morning,   R        08/17/20 2233          Vitals/Pain Today's Vitals   08/17/20 2253 08/17/20 2330 08/18/20 0022 08/18/20 0046  BP:  (!) 149/82 138/76   Pulse:  (!) 59 70   Resp:  20 18   Temp:   98.6 F (37 C)   TempSrc:   Oral   SpO2:  99% 99%   Weight:      Height:      PainSc: 4   6  3      Isolation Precautions Airborne and Contact precautions  Medications Medications  escitalopram (LEXAPRO) tablet 10 mg (has no administration in time range)  acetaminophen (TYLENOL) tablet 650 mg (  has no administration in time range)    Or  acetaminophen (TYLENOL) suppository 650 mg (has no administration in time range)  HYDROcodone-acetaminophen (NORCO/VICODIN) 5-325 MG per tablet 1-2 tablet (has no administration in time range)  lactated ringers infusion ( Intravenous New Bag/Given 08/17/20 2330)  HYDROmorphone (DILAUDID) injection 0.5 mg (0.5 mg Intravenous Given 08/18/20 0026)  nicotine (NICODERM CQ - dosed in mg/24 hours) patch 14 mg (14 mg Transdermal Patch Applied 08/18/20 0018)  lactated ringers bolus 2,000 mL (0 mLs Intravenous Stopped 08/17/20 2330)  ondansetron (ZOFRAN) injection 4 mg  (4 mg Intravenous Given 08/17/20 2134)    Mobility walks Low fall risk   Focused Assessments Pulmonary Assessment Handoff:  Lung sounds: Bilateral Breath Sounds: Clear O2 Device: Room Air        R Recommendations: See Admitting Provider Note  Report given to:   Additional Notes:

## 2020-08-19 ENCOUNTER — Inpatient Hospital Stay (HOSPITAL_COMMUNITY): Payer: No Typology Code available for payment source

## 2020-08-19 DIAGNOSIS — K859 Acute pancreatitis without necrosis or infection, unspecified: Secondary | ICD-10-CM | POA: Diagnosis not present

## 2020-08-19 LAB — CBC WITH DIFFERENTIAL/PLATELET
Abs Immature Granulocytes: 0.01 10*3/uL (ref 0.00–0.07)
Basophils Absolute: 0 10*3/uL (ref 0.0–0.1)
Basophils Relative: 0 %
Eosinophils Absolute: 0.1 10*3/uL (ref 0.0–0.5)
Eosinophils Relative: 2 %
HCT: 33.2 % — ABNORMAL LOW (ref 36.0–46.0)
Hemoglobin: 10.7 g/dL — ABNORMAL LOW (ref 12.0–15.0)
Immature Granulocytes: 0 %
Lymphocytes Relative: 48 %
Lymphs Abs: 2.9 10*3/uL (ref 0.7–4.0)
MCH: 29.3 pg (ref 26.0–34.0)
MCHC: 32.2 g/dL (ref 30.0–36.0)
MCV: 91 fL (ref 80.0–100.0)
Monocytes Absolute: 0.4 10*3/uL (ref 0.1–1.0)
Monocytes Relative: 7 %
Neutro Abs: 2.6 10*3/uL (ref 1.7–7.7)
Neutrophils Relative %: 43 %
Platelets: 234 10*3/uL (ref 150–400)
RBC: 3.65 MIL/uL — ABNORMAL LOW (ref 3.87–5.11)
RDW: 12.3 % (ref 11.5–15.5)
WBC: 6 10*3/uL (ref 4.0–10.5)
nRBC: 0 % (ref 0.0–0.2)

## 2020-08-19 LAB — BASIC METABOLIC PANEL
Anion gap: 5 (ref 5–15)
BUN: 7 mg/dL (ref 6–20)
CO2: 26 mmol/L (ref 22–32)
Calcium: 8.6 mg/dL — ABNORMAL LOW (ref 8.9–10.3)
Chloride: 106 mmol/L (ref 98–111)
Creatinine, Ser: 0.73 mg/dL (ref 0.44–1.00)
GFR, Estimated: >60 mL/min (ref >60)
Glucose, Bld: 75 mg/dL (ref 70–99)
Potassium: 4.1 mmol/L (ref 3.5–5.1)
Sodium: 137 mmol/L (ref 135–145)

## 2020-08-19 LAB — LIPID PANEL
Cholesterol: 123 mg/dL (ref 0–200)
HDL: 28 mg/dL — ABNORMAL LOW (ref >40)
LDL Cholesterol: 85 mg/dL (ref 0–99)
Total CHOL/HDL Ratio: 4.4 RATIO
Triglycerides: 50 mg/dL (ref <150)
VLDL: 10 mg/dL (ref 0–40)

## 2020-08-19 LAB — LIPASE, BLOOD: Lipase: 100 U/L — ABNORMAL HIGH (ref 11–51)

## 2020-08-19 MED ORDER — BOOST / RESOURCE BREEZE PO LIQD CUSTOM
1.0000 | Freq: Three times a day (TID) | ORAL | Status: DC
  Administered 2020-08-19: 1 via ORAL

## 2020-08-19 MED ORDER — GADOBUTROL 1 MMOL/ML IV SOLN
9.0000 mL | Freq: Once | INTRAVENOUS | Status: AC | PRN
  Administered 2020-08-19: 9 mL via INTRAVENOUS

## 2020-08-19 NOTE — Progress Notes (Signed)
PROGRESS NOTE    Shannon Holloway  TGG:[id number] DOB: Jun 16, 1995 DOA: 08/17/2020 PCP: Leilani Able, MD   Chief Complain: Abdominal pain  Brief Narrative: Patient is a 25 year old female with history of recurrent pancreatitis, tobacco abuse who presented to the emergency department with complaints of abdominal, vomiting.  She has history of pancreatic divisum and follows with GI.  Lab work on presentation showed elevated lipase.  GI consulted, underwent MRCP today.  Assessment & Plan:   Active Problems:   Pancreatitis   Tobacco abuse   Acute recurrent pancreatitis: Presented with abdomen pain, nausea and vomiting.  Has history of recurrent pancreatitis.  History of pancreatitis and follows with GI.  Lipase was elevated on presentation,improving.  Continue gentle  IV fluids.  [facility] consulted.   MRCP showed stable12 mm cystic focus in the head of the pancreas , tiny fluid collection within the head of the pancreas,a tiny pseudocyst.Showed known Pancreatic divisum GI service you are recommending to follow-up at [facility] with Dr. Corliss Parish for pancreatic duct sphincterotomy because she has recurrent pancreatitis.  Also recommended outpatient genetic counseling for underlying genetic etiology for pancreatitis. Diet advanced to full.  Her abdomen pain, nausea, vomiting is much better today.  We will gradually advance her diet.  Hypokalemia: Being supplemented with potassium.  Continue IV fluids  Tobacco abuse: Counseled for cessation.  Nicotine patch ordered.         DVT prophylaxis:SCD Code Status: Full Family Communication: Called mother on phone,call not received Status is: Inpatient  Remains inpatient appropriate because:Inpatient level of care appropriate due to severity of illness   Dispo: The patient is from: Home              Anticipated d/c is to: Home tomorrow              Patient currently is not medically stable to d/c.   Difficult to place patient  No     Consultants: GI  Procedures:None  Antimicrobials:  Anti-infectives (From admission, onward)   None      Subjective:  Patient seen and examined the bedside this morning.  Hemodynamically stable.  Denies any abdomen pain, nausea or vomiting today.  Hungry and eager to eat food.  Waiting for MRCP this morning.  Objective: Vitals:   08/18/20 2133 08/19/20 0600 08/19/20 0609 08/19/20 0614  BP: 134/77 131/74    Pulse: 62 (!) 49  (!) 59  Resp: 20 20 16    Temp: 98.2 F (36.8 C) 97.8 F (36.6 C)    TempSrc: Oral Oral    SpO2: 99% 100%    Weight:      Height:        Intake/Output Summary (Last 24 hours) at 08/19/2020 0854 Last data filed at 08/19/2020 0600 Gross per 24 hour  Intake 2850 ml  Output --  Net 2850 ml   Filed Weights   08/17/20 1950 08/18/20 0152  Weight: 93 kg 92.1 kg    Examination:  General exam: Overall comfortable, not in distress HEENT: PERRL Respiratory system:  no wheezes or crackles  Cardiovascular system: S1 & S2 heard, RRR.  Gastrointestinal system: Abdomen is nondistended, soft and nontender. Central nervous system: Alert and oriented Extremities: No edema, no clubbing ,no cyanosis Skin: No rashes, no ulcers,no icterus    Data Reviewed: I have personally reviewed following labs and imaging studies  CBC: Recent Labs  Lab 08/17/20 2032 08/18/20 0712 08/19/20 0453  WBC 9.1 7.2 6.0  NEUTROABS  --  3.8 2.6  HGB 12.5 11.1* 10.7*  HCT 38.2 34.7* 33.2*  MCV 89.5 91.1 91.0  PLT 279 236 234   Basic Metabolic Panel: Recent Labs  Lab 08/17/20 2031 08/17/20 2032 08/18/20 0712 08/19/20 0453  NA  --  137 138 137  K  --  3.7 3.3* 4.1  CL  --  103 104 106  CO2  --  24 25 26   GLUCOSE  --  89 85 75  BUN  --  13 10 7   CREATININE  --  0.78 0.75 0.73  CALCIUM  --  9.0 8.7* 8.6*  MG 2.3  --  2.0  --   PHOS 3.6  --  3.0  --    GFR: Estimated Creatinine Clearance: 127.4 mL/min (by C-G formula based on SCr of 0.73 mg/dL). Liver  Function Tests: Recent Labs  Lab 08/17/20 2032 08/18/20 0712  AST 16 14*  ALT 16 13  ALKPHOS 66 56  BILITOT 0.5 0.5  PROT 7.8 6.8  ALBUMIN 4.0 3.3*   Recent Labs  Lab 08/17/20 2032 08/18/20 0712 08/19/20 0453  LIPASE 788* 304* 100*   No results for input(s): AMMONIA in the last 168 hours. Coagulation Profile: No results for input(s): INR, PROTIME in the last 168 hours. Cardiac Enzymes: No results for input(s): CKTOTAL, CKMB, CKMBINDEX, TROPONINI in the last 168 hours. BNP (last 3 results) No results for input(s): PROBNP in the last 8760 hours. HbA1C: No results for input(s): HGBA1C in the last 72 hours. CBG: No results for input(s): GLUCAP in the last 168 hours. Lipid Profile: Recent Labs    08/19/20 0453  CHOL 123  HDL 28*  LDLCALC 85  TRIG 50  CHOLHDL 4.4   Thyroid Function Tests: Recent Labs    08/18/20 0712  TSH 1.509   Anemia Panel: No results for input(s): VITAMINB12, FOLATE, FERRITIN, TIBC, IRON, RETICCTPCT in the last 72 hours. Sepsis Labs: No results for input(s): PROCALCITON, LATICACIDVEN in the last 168 hours.  Recent Results (from the past 240 hour(s))  Resp Panel by RT-PCR (Flu A&B, Covid) Nasopharyngeal Swab     Status: None   Collection Time: 08/17/20 10:53 PM   Specimen: Nasopharyngeal Swab; Nasopharyngeal(NP) swabs in vial transport medium  Result Value Ref Range Status   SARS Coronavirus 2 by RT PCR NEGATIVE NEGATIVE Final    Comment: (NOTE) SARS-CoV-2 target nucleic acids are NOT DETECTED.  The SARS-CoV-2 RNA is generally detectable in upper respiratory specimens during the acute phase of infection. The lowest concentration of SARS-CoV-2 viral copies this assay can detect is 138 copies/mL. A negative result does not preclude SARS-Cov-2 infection and should not be used as the sole basis for treatment or other patient management decisions. A negative result may occur with  improper specimen collection/handling, submission of specimen  other than nasopharyngeal swab, presence of viral mutation(s) within the areas targeted by this assay, and inadequate number of viral copies(<138 copies/mL). A negative result must be combined with clinical observations, patient history, and epidemiological information. The expected result is Negative.  Fact Sheet for Patients:  08/20/20  Fact Sheet for Healthcare Providers:  08/19/20  This test is no t yet approved or cleared by the [employer] FDA and  has been authorized for detection and/or diagnosis of SARS-CoV-2 by FDA under an Emergency Use Authorization (EUA). This EUA will remain  in effect (meaning this test can be used) for the duration of the COVID-19 declaration under Section 564(b)(1) of the Act, 21 U.S.C.section 360bbb-3(b)(1), unless the authorization is  terminated  or revoked sooner.       Influenza A by PCR NEGATIVE NEGATIVE Final   Influenza B by PCR NEGATIVE NEGATIVE Final    Comment: (NOTE) The Xpert Xpress SARS-CoV-2/FLU/RSV plus assay is intended as an aid in the diagnosis of influenza from Nasopharyngeal swab specimens and should not be used as a sole basis for treatment. Nasal washings and aspirates are unacceptable for Xpert Xpress SARS-CoV-2/FLU/RSV testing.  Fact Sheet for Patients: [employer]  Fact Sheet for Healthcare Providers: [employer]  This test is not yet approved or cleared by the [place] FDA and has been authorized for detection and/or diagnosis of SARS-CoV-2 by FDA under an Emergency Use Authorization (EUA). This EUA will remain in effect (meaning this test can be used) for the duration of the COVID-19 declaration under Section 564(b)(1) of the Act, 21 U.S.C. section 360bbb-3(b)(1), unless the authorization is terminated or revoked.  Performed at [facility], [address] [place], [place] [address]          Radiology Studies: No results found.      Scheduled Meds: . escitalopram  10 mg Oral Daily  . nicotine  14 mg Transdermal Daily   Continuous Infusions: . lactated ringers 125 mL/hr at 08/19/20 0243     LOS: 2 days    Time spent: More than 50% of that time was spent in counseling and/or coordination of care.      Burnadette Pop, MD [facility] P5/31/2022, 8:54 AM

## 2020-08-19 NOTE — Progress Notes (Signed)
Initial Nutrition Assessment  DOCUMENTATION CODES:   Obesity unspecified  INTERVENTION:   Once diet advanced: -Boost Breeze po TID, each supplement provides 250 kcal and 9 grams of protein  NUTRITION DIAGNOSIS:   Increased nutrient needs related to acute illness as evidenced by estimated needs.  GOAL:   Patient will meet greater than or equal to 90% of their needs  MONITOR:   PO intake,Supplement acceptance,Labs,Weight trends,I & O's  REASON FOR ASSESSMENT:   Malnutrition Screening Tool    ASSESSMENT:   25 year old female with history of recurrent pancreatitis, tobacco abuse who presented to the emergency department with complaints of abdominal, vomiting.  She has history of pancreatic divisum and follows with GI.  Patient currently unavailable, having MRI/MRCP today. Pt NPO for procedure. Pt has been having poor appetite r/t stress per MST screen. Pt has had diarrhea and abdominal pain for a few days PTA.  Once diet advanced, recommend Boost Breeze supplements.  Per weight records, pt has lost 6 lbs since October 2020. Insignificant for time frame.   Medications: Lactated ringers   Labs reviewed.  NUTRITION - FOCUSED PHYSICAL EXAM:  Unable to complete  Diet Order:   Diet Order            Diet NPO time specified Except for: Ice Chips, Sips with Meds  Diet effective now                 EDUCATION NEEDS:   No education needs have been identified at this time  Skin:  Skin Assessment: Reviewed RN Assessment  Last BM:  5/29  Height:   Ht Readings from Last 1 Encounters:  08/18/20 5\' 7"  (1.702 m)    Weight:   Wt Readings from Last 1 Encounters:  08/18/20 92.1 kg   BMI:  Body mass index is 31.79 kg/m.  Estimated Nutritional Needs:   Kcal:  1750-1950  Protein:  70-85g  Fluid:  1.9L/day  08/20/20, MS, RD, LDN Inpatient Clinical Dietitian Contact information available via Amion

## 2020-08-19 NOTE — Progress Notes (Signed)
[facility] Progress Note  Shannon Holloway 25 y.o. 07-Jul-1995  CC:  Pancreatitis  Subjective: Patient reports feeling better but has continued epigastric discomfort.  She has been having diarrhea, 1-2 stools per day. Denies nausea/vomiting.  Patient denies known family history of pancreatitis, but does report paternal grandmother with pancreatic cancer, age unknown.  ROS : Review of Systems  Cardiovascular: Negative for chest pain and palpitations.  Gastrointestinal: Positive for abdominal pain and diarrhea. Negative for blood in stool, constipation, heartburn, melena, nausea and vomiting.   Objective: Vital signs in last 24 hours: Vitals:   08/19/20 0609 08/19/20 0614  BP:    Pulse:  (!) 59  Resp: 16   Temp:    SpO2:      Physical Exam:  General:  Alert, cooperative, no distress  Head:  Normocephalic, without obvious abnormality, atraumatic  Eyes:  Anicteric sclera, EOMs intact  Lungs:   Clear to auscultation bilaterally, respirations unlabored  Heart:  Regular rate and rhythm, S1, S2 normal  Abdomen:   Soft and non-distended with mild epigastric tenderness to palpation  Extremities: Extremities normal, atraumatic, no  edema    Lab Results: Recent Labs    08/17/20 2031 08/17/20 2032 08/18/20 0712 08/19/20 0453  NA  --    < > 138 137  K  --    < > 3.3* 4.1  CL  --    < > 104 106  CO2  --    < > 25 26  GLUCOSE  --    < > 85 75  BUN  --    < > 10 7  CREATININE  --    < > 0.75 0.73  CALCIUM  --    < > 8.7* 8.6*  MG 2.3  --  2.0  --   PHOS 3.6  --  3.0  --    < > = values in this interval not displayed.   Recent Labs    08/17/20 2032 08/18/20 0712  AST 16 14*  ALT 16 13  ALKPHOS 66 56  BILITOT 0.5 0.5  PROT 7.8 6.8  ALBUMIN 4.0 3.3*   Recent Labs    08/18/20 0712 08/19/20 0453  WBC 7.2 6.0  NEUTROABS 3.8 2.6  HGB 11.1* 10.7*  HCT 34.7* 33.2*  MCV 91.1 91.0  PLT 236 234   No results for input(s): LABPROT, INR in the last 72  hours.    Assessment: Acute pancreatitis -Normal renal function: BUN 7/ Cr 0.73 -Normal LFTs as of 5/30 -No leukocytosis  History of recurrent pancreatitis, as early as age 62, with reported pancreatic divisum  Plan: Await MRI/MRCP.  After imaging, OK to advance to full liquid diet.  Presuming pancreatic divisum is confirmed, recommend outpatient referral to [facility] (Dr. Corliss Parish) for pancreatic duct sphincterotomy, given recurrent pancreatitis.  Also, recommend outpatient genetic counseling/work-up for underlying genetic etiologies of pancreatitis.  [facility] will follow.  Edrick Kins PA-C 08/19/2020, 9:09 AM  Contact #  [phone]

## 2020-08-20 DIAGNOSIS — K859 Acute pancreatitis without necrosis or infection, unspecified: Secondary | ICD-10-CM | POA: Diagnosis not present

## 2020-08-20 LAB — COMPREHENSIVE METABOLIC PANEL
ALT: 13 U/L (ref 0–44)
AST: 14 U/L — ABNORMAL LOW (ref 15–41)
Albumin: 3.4 g/dL — ABNORMAL LOW (ref 3.5–5.0)
Alkaline Phosphatase: 54 U/L (ref 38–126)
Anion gap: 7 (ref 5–15)
BUN: 8 mg/dL (ref 6–20)
CO2: 24 mmol/L (ref 22–32)
Calcium: 8.9 mg/dL (ref 8.9–10.3)
Chloride: 107 mmol/L (ref 98–111)
Creatinine, Ser: 0.66 mg/dL (ref 0.44–1.00)
GFR, Estimated: >60 mL/min (ref >60)
Glucose, Bld: 81 mg/dL (ref 70–99)
Potassium: 3.9 mmol/L (ref 3.5–5.1)
Sodium: 138 mmol/L (ref 135–145)
Total Bilirubin: 0.1 mg/dL — ABNORMAL LOW (ref 0.3–1.2)
Total Protein: 6.7 g/dL (ref 6.5–8.1)

## 2020-08-20 LAB — LIPASE, BLOOD: Lipase: 72 U/L — ABNORMAL HIGH (ref 11–51)

## 2020-08-20 MED ORDER — PANCRELIPASE (LIP-PROT-AMYL) 12000-38000 UNITS PO CPEP: 36000.0000 [IU] | ORAL_CAPSULE | Freq: Three times a day (TID) | ORAL | Status: DC

## 2020-08-20 NOTE — Discharge Summary (Signed)
Physician Discharge Summary  Shannon Holloway WVP:[id number] DOB: [birth year] DOA: 08/17/2020  PCP: Leilani Able, MD  Admit date: 08/17/2020 Discharge date: 08/20/2020  Admitted From: Home Disposition:  Home  Discharge Condition:Stable CODE STATUS:FULL Diet recommendation:  Regular    Brief/Interim Summary: Patient is a 25 year old female with history of recurrent pancreatitis, tobacco abuse who presented to the emergency department with complaints of abdominal, vomiting.  She has history of pancreatic divisum and follows with GI.  Lab work on presentation showed elevated lipase.  GI consulted, underwent MRCP.  Currently sees abdominal pain-free, no nausea or vomiting, tolerating diet.  She is cleared by GI for discharge.  She will follow-up with [facility] as an outpatient.  Following problems were addressed during her hospitalization:   Acute recurrent pancreatitis: Presented with abdomen pain, nausea and vomiting.  Has history of recurrent pancreatitis.  History of pancreatitis and follows with GI.  Lipase was elevated on presentation,improved. [facility] consulted.   MRCP showed stable12 mm cystic focus in the head of the pancreas , tiny fluid collection within the head of the pancreas,a tiny pseudocyst.Showed known pancreatic divisum GI service are recommending to follow-up at [facility] with Dr. Corliss Parish for pancreatic duct sphincterotomy because she has recurrent pancreatitis.  Also recommended outpatient genetic counseling for underlying genetic etiology for pancreatitis. Diet advanced to regular .  Her abdomen pain, nausea, vomiting have resolved  today.    Hypokalemia: Supplemented with potassium  Tobacco abuse: Counseled for cessation.  Nicotine patch was ordered.    Discharge Diagnoses:  Active Problems:   Pancreatitis   Tobacco abuse    Discharge Instructions  Discharge Instructions    Diet general   Complete by: As directed    Discharge instructions   Complete by: As  directed    1)You have been referred to [facility] gastroenterology.  If you have any questions, please call [facility] office.   Increase activity slowly   Complete by: As directed      Allergies as of 08/20/2020   No Known Allergies     Medication List    TAKE these medications   escitalopram 10 MG tablet Commonly known as: LEXAPRO Take 1 tablet (10 mg total) by mouth daily.   ibuprofen 200 MG tablet Commonly known as: ADVIL Take 800 mg by mouth 3 (three) times daily as needed for moderate pain.   Junel 1/20 1-20 MG-MCG tablet Generic drug: norethindrone-ethinyl estradiol Take 1 tablet by mouth daily.       Follow-up Information    Kathi Der, MD. Call.   Specialty: Gastroenterology Why: We will send a referral to [facility] for further evaluation/management of recurrent pancreatitis and pancreatic divisum.  If you need anything in the meantime, please contact [facility] (information listed above). Contact information: [address] [place] [address] [phone]              No Known Allergies  Consultations:  GI   Procedures/Studies: MR 3D Recon At Scanner  Result Date: 08/19/2020 CLINICAL DATA:  Pancreatitis. EXAM: MRI ABDOMEN WITHOUT AND WITH CONTRAST (INCLUDING MRCP) TECHNIQUE: Multiplanar multisequence MR imaging of the abdomen was performed both before and after the administration of intravenous contrast. Heavily T2-weighted images of the biliary and pancreatic ducts were obtained, and three-dimensional MRCP images were rendered by post processing. CONTRAST:  72mL GADAVIST GADOBUTROL 1 MMOL/ML IV SOLN COMPARISON:  Abdomen CT 12/04/2019 FINDINGS: Lower chest: Unremarkable. Hepatobiliary: No suspicious focal abnormality within the liver parenchyma. Gallbladder surgically  absent. No intrahepatic or extrahepatic biliary dilation. No evidence for choledocholithiasis. Pancreas: As on prior CT, there is parenchymal atrophy in the body and tail of pancreas  without main duct dilatation. Soft tissue fullness in the head of pancreas is also similar to prior CT. Ductal anatomy today is consistent with pancreas divisum. 12 mm cystic focus in the head of the pancreas was probably present on the CT scan from 03/12/2017 and measures minimally smaller from the CT of 12/04/2019. This is probably a tiny fluid collection within the head of the pancreas. This is along the distal gastric antrum duodenal diverticulum is not considered a likely etiology. Spleen:  No splenomegaly. No focal mass lesion. Adrenals/Urinary Tract: No adrenal nodule or mass. Kidneys unremarkable. Stomach/Bowel: Stomach is unremarkable. No gastric wall thickening. No evidence of outlet obstruction. Duodenum is normally positioned as is the ligament of Treitz. No small bowel or colonic dilatation within the visualized abdomen. Vascular/Lymphatic: No abdominal aortic aneurysm. No abdominal lymphadenopathy. Other: No intraperitoneal free fluid. There is some mild edema in the right retroperitoneal space tracking down towards the right paracolic gutter. Musculoskeletal: No focal suspicious marrow enhancement within the visualized bony anatomy. IMPRESSION: 1. 12 mm cystic focus in the head of the pancreas was probably present on the CT scan from 03/12/2017 and measures minimally smaller from the CT of 12/04/2019. This is probably a tiny fluid collection within the head of the pancreas and may be a tiny pseudocyst. 2. Pancreas divisum. No substantial edema or inflammation in the peripancreatic tissues today. There is some trace edema in the right retroperitoneal space tacking down towards the right paracolic gutter. 3. No biliary dilatation.  No choledocholithiasis. 4. No intrahepatic or extrahepatic biliary dilation. No evidence for choledocholithiasis. Electronically Signed   By: Kennith CenterEric  Mansell M.D.   On: 08/19/2020 12:28   MR ABDOMEN MRCP W WO CONTAST  Result Date: 08/19/2020 CLINICAL DATA:  Pancreatitis.  EXAM: MRI ABDOMEN WITHOUT AND WITH CONTRAST (INCLUDING MRCP) TECHNIQUE: Multiplanar multisequence MR imaging of the abdomen was performed both before and after the administration of intravenous contrast. Heavily T2-weighted images of the biliary and pancreatic ducts were obtained, and three-dimensional MRCP images were rendered by post processing. CONTRAST:  9mL GADAVIST GADOBUTROL 1 MMOL/ML IV SOLN COMPARISON:  Abdomen CT 12/04/2019 FINDINGS: Lower chest: Unremarkable. Hepatobiliary: No suspicious focal abnormality within the liver parenchyma. Gallbladder surgically absent. No intrahepatic or extrahepatic biliary dilation. No evidence for choledocholithiasis. Pancreas: As on prior CT, there is parenchymal atrophy in the body and tail of pancreas without main duct dilatation. Soft tissue fullness in the head of pancreas is also similar to prior CT. Ductal anatomy today is consistent with pancreas divisum. 12 mm cystic focus in the head of the pancreas was probably present on the CT scan from 03/12/2017 and measures minimally smaller from the CT of 12/04/2019. This is probably a tiny fluid collection within the head of the pancreas. This is along the distal gastric antrum duodenal diverticulum is not considered a likely etiology. Spleen:  No splenomegaly. No focal mass lesion. Adrenals/Urinary Tract: No adrenal nodule or mass. Kidneys unremarkable. Stomach/Bowel: Stomach is unremarkable. No gastric wall thickening. No evidence of outlet obstruction. Duodenum is normally positioned as is the ligament of Treitz. No small bowel or colonic dilatation within the visualized abdomen. Vascular/Lymphatic: No abdominal aortic aneurysm. No abdominal lymphadenopathy. Other: No intraperitoneal free fluid. There is some mild edema in the right retroperitoneal space tracking down towards the right paracolic gutter. Musculoskeletal: No focal suspicious marrow  enhancement within the visualized bony anatomy. IMPRESSION: 1. 12 mm  cystic focus in the head of the pancreas was probably present on the CT scan from 03/12/2017 and measures minimally smaller from the CT of 12/04/2019. This is probably a tiny fluid collection within the head of the pancreas and may be a tiny pseudocyst. 2. Pancreas divisum. No substantial edema or inflammation in the peripancreatic tissues today. There is some trace edema in the right retroperitoneal space tacking down towards the right paracolic gutter. 3. No biliary dilatation.  No choledocholithiasis. 4. No intrahepatic or extrahepatic biliary dilation. No evidence for choledocholithiasis. Electronically Signed   By: Kennith Center M.D.   On: 08/19/2020 12:28       Subjective:  Patient seen and examined the bedside this morning.  Hemodynamically stable for discharge today.   Discharge Exam: Vitals:   08/19/20 2107 08/20/20 0420  BP: (!) 142/95 (!) 138/103  Pulse: (!) 53 (!) 51  Resp: 19 18  Temp: 98.8 F (37.1 C) 98 F (36.7 C)  SpO2: 99% 99%   Vitals:   08/19/20 0614 08/19/20 1403 08/19/20 2107 08/20/20 0420  BP:  136/86 (!) 142/95 (!) 138/103  Pulse: (!) 59 (!) 50 (!) 53 (!) 51  Resp:  14 19 18   Temp:  98.2 F (36.8 C) 98.8 F (37.1 C) 98 F (36.7 C)  TempSrc:  Oral Oral Oral  SpO2:  100% 99% 99%  Weight:      Height:        General: Pt is alert, awake, not in acute distress Cardiovascular: RRR, S1/S2 +, no rubs, no gallops Respiratory: CTA bilaterally, no wheezing, no rhonchi Abdominal: Soft, NT, ND, bowel sounds + Extremities: no edema, no cyanosis    The results of significant diagnostics from this hospitalization (including imaging, microbiology, ancillary and laboratory) are listed below for reference.     Microbiology: Recent Results (from the past 240 hour(s))  Resp Panel by RT-PCR (Flu A&B, Covid) Nasopharyngeal Swab     Status: None   Collection Time: 08/17/20 10:53 PM   Specimen: Nasopharyngeal Swab; Nasopharyngeal(NP) swabs in vial transport medium   Result Value Ref Range Status   SARS Coronavirus 2 by RT PCR NEGATIVE NEGATIVE Final    Comment: (NOTE) SARS-CoV-2 target nucleic acids are NOT DETECTED.  The SARS-CoV-2 RNA is generally detectable in upper respiratory specimens during the acute phase of infection. The lowest concentration of SARS-CoV-2 viral copies this assay can detect is 138 copies/mL. A negative result does not preclude SARS-Cov-2 infection and should not be used as the sole basis for treatment or other patient management decisions. A negative result may occur with  improper specimen collection/handling, submission of specimen other than nasopharyngeal swab, presence of viral mutation(s) within the areas targeted by this assay, and inadequate number of viral copies(<138 copies/mL). A negative result must be combined with clinical observations, patient history, and epidemiological information. The expected result is Negative.  Fact Sheet for Patients:  08/19/20  Fact Sheet for Healthcare Providers:  BloggerCourse.com  This test is no t yet approved or cleared by the [place] FDA and  has been authorized for detection and/or diagnosis of SARS-CoV-2 by FDA under an Emergency Use Authorization (EUA). This EUA will remain  in effect (meaning this test can be used) for the duration of the COVID-19 declaration under Section 564(b)(1) of the Act, 21 U.S.C.section 360bbb-3(b)(1), unless the authorization is terminated  or revoked sooner.       Influenza A by PCR  NEGATIVE NEGATIVE Final   Influenza B by PCR NEGATIVE NEGATIVE Final    Comment: (NOTE) The Xpert Xpress SARS-CoV-2/FLU/RSV plus assay is intended as an aid in the diagnosis of influenza from Nasopharyngeal swab specimens and should not be used as a sole basis for treatment. Nasal washings and aspirates are unacceptable for Xpert Xpress SARS-CoV-2/FLU/RSV testing.  Fact Sheet for  Patients: BloggerCourse.com  Fact Sheet for Healthcare Providers: SeriousBroker.it  This test is not yet approved or cleared by the [place] FDA and has been authorized for detection and/or diagnosis of SARS-CoV-2 by FDA under an Emergency Use Authorization (EUA). This EUA will remain in effect (meaning this test can be used) for the duration of the COVID-19 declaration under Section 564(b)(1) of the Act, 21 U.S.C. section 360bbb-3(b)(1), unless the authorization is terminated or revoked.  Performed at [facility], [address], [place], [place] [address]      Labs: BNP (last 3 results) No results for input(s): BNP in the last 8760 hours. Basic Metabolic Panel: Recent Labs  Lab 08/17/20 2031 08/17/20 2032 08/18/20 0712 08/19/20 0453 08/20/20 0501  NA  --  137 138 137 138  K  --  3.7 3.3* 4.1 3.9  CL  --  103 104 106 107  CO2  --  GLUCOSE  --  89 85 75 81  BUN  --  CREATININE  --  0.78 0.75 0.73 0.66  CALCIUM  --  9.0 8.7* 8.6* 8.9  MG 2.3  --  2.0  --   --   PHOS 3.6  --  3.0  --   --    Liver Function Tests: Recent Labs  Lab 08/17/20 2032 08/18/20 0712 08/20/20 0501  AST 16 14* 14*  ALT ALKPHOS 66 56 54  BILITOT 0.5 0.5 0.1*  PROT 7.8 6.8 6.7  ALBUMIN 4.0 3.3* 3.4*   Recent Labs  Lab 08/17/20 2032 08/18/20 0712 08/19/20 0453 08/20/20 0501  LIPASE 788* 304* 100* 72*   No results for input(s): AMMONIA in the last 168 hours. CBC: Recent Labs  Lab 08/17/20 2032 08/18/20 0712 08/19/20 0453  WBC 9.1 7.2 6.0  NEUTROABS  --  3.8 2.6  HGB 12.5 11.1* 10.7*  HCT 38.2 34.7* 33.2*  MCV 89.5 91.1 91.0  PLT 279 236 234   Cardiac Enzymes: No results for input(s): CKTOTAL, CKMB, CKMBINDEX, TROPONINI in the last 168 hours. BNP: Invalid input(s): POCBNP CBG: No results for input(s): GLUCAP in the last 168 hours. D-Dimer No results for input(s):  DDIMER in the last 72 hours. Hgb A1c No results for input(s): HGBA1C in the last 72 hours. Lipid Profile Recent Labs    08/19/20 0453  CHOL 123  HDL 28*  LDLCALC 85  TRIG 50  CHOLHDL 4.4   Thyroid function studies Recent Labs    08/18/20 0712  TSH 1.509   Anemia work up No results for input(s): VITAMINB12, FOLATE, FERRITIN, TIBC, IRON, RETICCTPCT in the last 72 hours. Urinalysis    Component Value Date/Time   COLORURINE YELLOW 08/17/2020 2020   APPEARANCEUR HAZY (A) 08/17/2020 2020   LABSPEC 1.017 08/17/2020 2020   PHURINE 6.0 08/17/2020 2020   GLUCOSEU NEGATIVE 08/17/2020 2020   HGBUR NEGATIVE 08/17/2020 2020   BILIRUBINUR NEGATIVE 08/17/2020 2020   BILIRUBINUR negative 11/22/2019 2003   KETONESUR 20 (A) 08/17/2020 2020   PROTEINUR NEGATIVE 08/17/2020 2020   UROBILINOGEN 1.0 11/22/2019 2003  UROBILINOGEN 0.2 04/03/2019 1126   NITRITE NEGATIVE 08/17/2020 2020   LEUKOCYTESUR NEGATIVE 08/17/2020 2020   Sepsis Labs Invalid input(s): PROCALCITONIN,  WBC,  LACTICIDVEN Microbiology Recent Results (from the past 240 hour(s))  Resp Panel by RT-PCR (Flu A&B, Covid) Nasopharyngeal Swab     Status: None   Collection Time: 08/17/20 10:53 PM   Specimen: Nasopharyngeal Swab; Nasopharyngeal(NP) swabs in vial transport medium  Result Value Ref Range Status   SARS Coronavirus 2 by RT PCR NEGATIVE NEGATIVE Final    Comment: (NOTE) SARS-CoV-2 target nucleic acids are NOT DETECTED.  The SARS-CoV-2 RNA is generally detectable in upper respiratory specimens during the acute phase of infection. The lowest concentration of SARS-CoV-2 viral copies this assay can detect is 138 copies/mL. A negative result does not preclude SARS-Cov-2 infection and should not be used as the sole basis for treatment or other patient management decisions. A negative result may occur with  improper specimen collection/handling, submission of specimen other than nasopharyngeal swab, presence of viral  mutation(s) within the areas targeted by this assay, and inadequate number of viral copies(<138 copies/mL). A negative result must be combined with clinical observations, patient history, and epidemiological information. The expected result is Negative.  Fact Sheet for Patients:  BloggerCourse.com  Fact Sheet for Healthcare Providers:  [employer]ious[employer]ker.it  This test is no t yet approved or cleared by the [place] FDA and  has been authorized for detection and/or diagnosis of SARS-CoV-2 by FDA under an Emergency Use Authorization (EUA). This EUA will remain  in effect (meaning this test can be used) for the duration of the COVID-19 declaration under Section 564(b)(1) of the Act, 21 U.S.C.section 360bbb-3(b)(1), unless the authorization is terminated  or revoked sooner.       Influenza A by PCR NEGATIVE NEGATIVE Final   Influenza B by PCR NEGATIVE NEGATIVE Final    Comment: (NOTE) The Xpert Xpress SARS-CoV-2/FLU/RSV plus assay is intended as an aid in the diagnosis of influenza from Nasopharyngeal swab specimens and should not be used as a sole basis for treatment. Nasal washings and aspirates are unacceptable for Xpert Xpress SARS-CoV-2/FLU/RSV testing.  Fact Sheet for Patients: BloggerCourse.com  Fact Sheet for Healthcare Providers: SeriousBroker.it  This test is not yet approved or cleared by the [place] FDA and has been authorized for detection and/or diagnosis of SARS-CoV-2 by FDA under an Emergency Use Authorization (EUA). This EUA will remain in effect (meaning this test can be used) for the duration of the COVID-19 declaration under Section 564(b)(1) of the Act, 21 U.S.C. section 360bbb-3(b)(1), unless the authorization is terminated or revoked.  Performed at [facility], [address] [place], [place] [address]     Please note: You  were cared for by a hospitalist during your hospital stay. Once you are discharged, your primary care physician will handle any further medical issues. Please note that NO REFILLS for any discharge medications will be authorized once you are discharged, as it is imperative that you return to your primary care physician (or establish a relationship with a primary care physician if you do not have one) for your post hospital discharge needs so that they can reassess your need for medications and monitor your lab values.    Time coordinating discharge: 40 minutes  SIGNED:   Burnadette Pop, MD  [facility] 08/20/2020, 10:19 AM Pager [phone]  If 7PM-7AM, please contact night-coverage [email or web address] Password [email or web address]

## 2020-08-20 NOTE — Progress Notes (Signed)
[facility] Progress Note  Shannon Holloway 25 y.o. 06/06/1995  CC:  Pancreatitis  Subjective: Patient reports feeling well.  Abdominal pain has resolved.  Denies nausea/vomiting.  Had a loose stool last night.  ROS : Review of Systems  Cardiovascular: Negative for chest pain and palpitations.  Gastrointestinal: Positive for diarrhea. Negative for abdominal pain, blood in stool, constipation, heartburn, melena, nausea and vomiting.   Objective: Vital signs in last 24 hours: Vitals:   08/19/20 2107 08/20/20 0420  BP: (!) 142/95 (!) 138/103  Pulse: (!) 53 (!) 51  Resp: 19 18  Temp: 98.8 F (37.1 C) 98 F (36.7 C)  SpO2: 99% 99%    Physical Exam:  General:  Alert, cooperative, no distress  Head:  Normocephalic, without obvious abnormality, atraumatic  Eyes:  Anicteric sclera, EOMs intact  Lungs:   Clear to auscultation bilaterally, respirations unlabored  Abdomen:   Soft, non-tender, and non-distended; normoactive bowel sounds  Extremities: Extremities normal, atraumatic, no  edema    Lab Results: Recent Labs    08/17/20 2031 08/17/20 2032 08/18/20 0712 08/19/20 0453 08/20/20 0501  NA  --    < > 138 137 138  K  --    < > 3.3* 4.1 3.9  CL  --    < > 104 106 107  CO2  --    < > 25 26 24   GLUCOSE  --    < > 85 75 81  BUN  --    < > 10 7 8   CREATININE  --    < > 0.75 0.73 0.66  CALCIUM  --    < > 8.7* 8.6* 8.9  MG 2.3  --  2.0  --   --   PHOS 3.6  --  3.0  --   --    < > = values in this interval not displayed.   Recent Labs    08/18/20 0712 08/20/20 0501  AST 14* 14*  ALT 13 13  ALKPHOS 56 54  BILITOT 0.5 0.1*  PROT 6.8 6.7  ALBUMIN 3.3* 3.4*   Recent Labs    08/18/20 0712 08/19/20 0453  WBC 7.2 6.0  NEUTROABS 3.8 2.6  HGB 11.1* 10.7*  HCT 34.7* 33.2*  MCV 91.1 91.0  PLT 236 234   No results for input(s): LABPROT, INR in the last 72 hours.    Assessment: Acute pancreatitis: MRI/MRCP with 64mm cystic focus in pancreatic head,  pancreas divisum, no substantial infalmmation -Normal renal function and normal LFTs  -No leukocytosis  History of recurrent pancreatitis, as early as age 4, with reported pancreatic divisum  Plan: Outpatient referral to [facility] for evaluation and consideration of pancreatic duct sphincterotomy, given recurrent pancreatitis.  OK to discharge from a GI standpoint.  [facility] will sign off. Please contact 4 if we can be of any further assistance during this hospital stay.  [facility] PA-C 08/20/2020, 10:03 AM  Contact #  [phone]

## 2020-08-21 LAB — IGG 1, 2, 3, AND 4
IgG (Immunoglobin G), Serum: 1249 mg/dL (ref 586–1602)
IgG, Subclass 1: 798 mg/dL (ref 248–810)
IgG, Subclass 2: 205 mg/dL (ref 130–555)
IgG, Subclass 3: 38 mg/dL (ref 15–102)
IgG, Subclass 4: 38 mg/dL (ref 2–96)

## 2020-09-29 ENCOUNTER — Telehealth (HOSPITAL_COMMUNITY): Payer: No Typology Code available for payment source | Admitting: Psychiatry

## 2020-11-24 ENCOUNTER — Other Ambulatory Visit (HOSPITAL_COMMUNITY): Payer: Self-pay | Admitting: Psychiatry

## 2020-11-26 ENCOUNTER — Emergency Department (HOSPITAL_COMMUNITY)
Admission: EM | Admit: 2020-11-26 | Discharge: 2020-11-26 | Disposition: A | Discharge status: Home / Self Care | Payer: No Typology Code available for payment source | Attending: Emergency Medicine | Admitting: Emergency Medicine

## 2020-11-26 ENCOUNTER — Encounter (HOSPITAL_COMMUNITY): Payer: Self-pay | Admitting: Oncology

## 2020-11-26 ENCOUNTER — Other Ambulatory Visit: Payer: Self-pay

## 2020-11-26 DIAGNOSIS — R0789 Other chest pain: Secondary | ICD-10-CM | POA: Insufficient documentation

## 2020-11-26 DIAGNOSIS — N9489 Other specified conditions associated with female genital organs and menstrual cycle: Secondary | ICD-10-CM | POA: Diagnosis not present

## 2020-11-26 DIAGNOSIS — K859 Acute pancreatitis without necrosis or infection, unspecified: Secondary | ICD-10-CM | POA: Diagnosis not present

## 2020-11-26 DIAGNOSIS — R1010 Upper abdominal pain, unspecified: Secondary | ICD-10-CM | POA: Diagnosis present

## 2020-11-26 LAB — COMPREHENSIVE METABOLIC PANEL
ALT: 15 U/L (ref 0–44)
AST: 18 U/L (ref 15–41)
Albumin: 4 g/dL (ref 3.5–5.0)
Alkaline Phosphatase: 63 U/L (ref 38–126)
Anion gap: 7 (ref 5–15)
BUN: 12 mg/dL (ref 6–20)
CO2: 24 mmol/L (ref 22–32)
Calcium: 9 mg/dL (ref 8.9–10.3)
Chloride: 106 mmol/L (ref 98–111)
Creatinine, Ser: 0.93 mg/dL (ref 0.44–1.00)
GFR, Estimated: >60 mL/min (ref >60)
Glucose, Bld: 81 mg/dL (ref 70–99)
Potassium: 3.8 mmol/L (ref 3.5–5.1)
Sodium: 137 mmol/L (ref 135–145)
Total Bilirubin: 0.2 mg/dL — ABNORMAL LOW (ref 0.3–1.2)
Total Protein: 7.5 g/dL (ref 6.5–8.1)

## 2020-11-26 LAB — URINALYSIS, ROUTINE W REFLEX MICROSCOPIC
Bilirubin Urine: NEGATIVE
Glucose, UA: NEGATIVE mg/dL
Ketones, ur: NEGATIVE mg/dL
Nitrite: NEGATIVE
Protein, ur: 30 mg/dL — AB
RBC / HPF: >50 RBC/hpf — ABNORMAL HIGH (ref 0–5)
Specific Gravity, Urine: 1.016 (ref 1.005–1.030)
pH: 6 (ref 5.0–8.0)

## 2020-11-26 LAB — CBC WITH DIFFERENTIAL/PLATELET
Abs Immature Granulocytes: 0.06 10*3/uL (ref 0.00–0.07)
Basophils Absolute: 0 10*3/uL (ref 0.0–0.1)
Basophils Relative: 0 %
Eosinophils Absolute: 0.1 10*3/uL (ref 0.0–0.5)
Eosinophils Relative: 0 %
HCT: 38.2 % (ref 36.0–46.0)
Hemoglobin: 12.2 g/dL (ref 12.0–15.0)
Immature Granulocytes: 0 %
Lymphocytes Relative: 10 %
Lymphs Abs: 1.4 10*3/uL (ref 0.7–4.0)
MCH: 29.8 pg (ref 26.0–34.0)
MCHC: 31.9 g/dL (ref 30.0–36.0)
MCV: 93.4 fL (ref 80.0–100.0)
Monocytes Absolute: 1.2 10*3/uL — ABNORMAL HIGH (ref 0.1–1.0)
Monocytes Relative: 8 %
Neutro Abs: 11.3 10*3/uL — ABNORMAL HIGH (ref 1.7–7.7)
Neutrophils Relative %: 82 %
Platelets: 259 10*3/uL (ref 150–400)
RBC: 4.09 MIL/uL (ref 3.87–5.11)
RDW: 12.6 % (ref 11.5–15.5)
WBC: 14 10*3/uL — ABNORMAL HIGH (ref 4.0–10.5)
nRBC: 0 % (ref 0.0–0.2)

## 2020-11-26 LAB — HCG, QUANTITATIVE, PREGNANCY: hCG, Beta Chain, Quant, S: <1 m[IU]/mL (ref <5)

## 2020-11-26 LAB — LIPASE, BLOOD: Lipase: 397 U/L — ABNORMAL HIGH (ref 11–51)

## 2020-11-26 MED ORDER — OXYCODONE HCL 5 MG PO TABS
10.0000 mg | ORAL_TABLET | Freq: Once | ORAL | Status: AC
  Administered 2020-11-26: 10 mg via ORAL
  Filled 2020-11-26: qty 2

## 2020-11-26 MED ORDER — MORPHINE SULFATE (PF) 4 MG/ML IV SOLN
4.0000 mg | Freq: Once | INTRAVENOUS | Status: AC
  Administered 2020-11-26: 4 mg via INTRAVENOUS
  Filled 2020-11-26: qty 1

## 2020-11-26 MED ORDER — HYDROMORPHONE HCL 1 MG/ML IJ SOLN
1.0000 mg | Freq: Once | INTRAMUSCULAR | Status: AC
  Administered 2020-11-26: 1 mg via INTRAVENOUS
  Filled 2020-11-26 (×2): qty 1

## 2020-11-26 MED ORDER — ONDANSETRON HCL 4 MG/2ML IJ SOLN
4.0000 mg | Freq: Once | INTRAMUSCULAR | Status: AC
  Administered 2020-11-26: 4 mg via INTRAVENOUS
  Filled 2020-11-26: qty 2

## 2020-11-26 MED ORDER — OXYCODONE HCL 5 MG PO TABS: 5.0000 mg | ORAL_TABLET | ORAL | 0 refills | Status: DC | PRN

## 2020-11-26 MED ORDER — HYDROMORPHONE HCL 1 MG/ML IJ SOLN: 1.0000 mg | Freq: Once | INTRAMUSCULAR | Status: DC

## 2020-11-26 MED ORDER — SODIUM CHLORIDE 0.9 % IV BOLUS
1000.0000 mL | Freq: Once | INTRAVENOUS | Status: AC
  Administered 2020-11-26: 1000 mL via INTRAVENOUS

## 2020-11-26 NOTE — Discharge Instructions (Addendum)
Liquid diet, advance slowly as tolerated.  Take your medications as directed for pain management. Follow-up with your care team, return to the emergency room for severe concerning symptoms.

## 2020-11-26 NOTE — ED Provider Notes (Signed)
[facility]-EMERGENCY DEPT Provider Note   CSN: [id number] Arrival date & time: 11/26/20  0865     History Chief Complaint  Patient presents with   Abdominal Pain    Shannon Holloway is a 25 y.o. female.  25 year old female brought in by EMS from home, history of pancreatitis, managed by Dr. Cheryl Flash with [facility].  Patient states that her abdominal pain woke her from her sleep at 4 AM today, associated with nausea and vomiting and tightness in her chest.  States symptoms are similar to prior flares of her pancreatitis however chest tightness is new today.  Denies fevers, chills, changes in bowel or bladder habits.  Patient states she recently moved to the area and does not have any medications to take for this episode. Pain is sharp in nature, located in mid to upper abdomen, radiates to diffuse abdomen. Patient has also been seen by [facility] in consideration for transplant.       Past Medical History:  Diagnosis Date   ADHD (attention deficit hyperactivity disorder)    Anxiety    Depression    Pancreatitis 2012    Patient Active Problem List   Diagnosis Date Noted   Pancreatitis 08/17/2020   Tobacco abuse 08/17/2020   History of panic attacks 05/19/2016   Major depressive disorder 05/19/2016    Past Surgical History:  Procedure Laterality Date   CHOLECYSTECTOMY  2012   HERNIA REPAIR  2001     OB History   No obstetric history on file.     Family History  Problem Relation Age of Onset   Anxiety disorder Mother    Anxiety disorder Maternal Grandmother     Social History   Tobacco Use   Smoking status: Never   Smokeless tobacco: Never  Substance Use Topics   Alcohol use: Yes    Comment: social   Drug use: Never    Home Medications Prior to Admission medications   Medication Sig Start Date End Date Taking? Authorizing Provider  escitalopram (LEXAPRO) 10 MG tablet Take 1 tablet (10 mg total) by mouth daily. 08/08/20  Yes Thresa Ross, MD  oxyCODONE (ROXICODONE) 5 MG immediate release tablet Take 1 tablet (5 mg total) by mouth every 4 (four) hours as needed for severe pain. 11/26/20  Yes Jeannie Fend, PA-C  buPROPion (WELLBUTRIN XL) 150 MG 24 hr tablet Take 1 tablet (150 mg total) by mouth daily. Patient not taking: Reported on 12/21/2018 02/24/17 12/21/18  Thresa Ross, MD  promethazine (PHENERGAN) 25 MG tablet Take 1 tablet (25 mg total) by mouth every 6 (six) hours as needed for nausea or vomiting. Patient not taking: Reported on 12/21/2018 03/12/17 12/21/18  Lawyer, Cristal Deer, PA-C  VYVANSE 20 MG capsule Take 1 capsule (20 mg total) by mouth every morning. Patient not taking: Reported on 11/24/2018 11/23/16 12/21/18  Thresa Ross, MD    Allergies    Patient has no known allergies.  Review of Systems   Review of Systems  Constitutional:  Negative for chills, diaphoresis and fever.  Respiratory:  Positive for chest tightness. Negative for shortness of breath.   Cardiovascular:  Negative for chest pain.  Gastrointestinal:  Positive for abdominal pain, nausea and vomiting. Negative for blood in stool, constipation and diarrhea.  Genitourinary:  Negative for dysuria and frequency.  Musculoskeletal:  Negative for arthralgias and myalgias.  Skin:  Negative for rash and wound.  Allergic/Immunologic: Negative for immunocompromised state.  Neurological:  Negative for weakness.  Psychiatric/Behavioral:  Negative  for confusion.   All other systems reviewed and are negative.  Physical Exam Updated Vital Signs BP 123/82   Pulse 63   Temp 98.9 F (37.2 C) (Oral)   Resp (!) 21   Ht 5\' 7"  (1.702 m)   Wt 90.7 kg   LMP 11/25/2020   SpO2 99%   BMI 31.32 kg/m   Physical Exam Vitals and nursing note reviewed.  Constitutional:      General: She is not in acute distress.    Appearance: She is well-developed. She is not diaphoretic.     Comments: tearful  HENT:     Head: Normocephalic and atraumatic.   Cardiovascular:     Rate and Rhythm: Normal rate and regular rhythm.     Heart sounds: Normal heart sounds.  Pulmonary:     Effort: Pulmonary effort is normal.  Abdominal:     Palpations: Abdomen is soft.     Tenderness: There is generalized abdominal tenderness.  Skin:    General: Skin is warm and dry.  Neurological:     Mental Status: She is alert and oriented to person, place, and time.  Psychiatric:        Behavior: Behavior normal.    ED Results / Procedures / Treatments   Labs (all labs ordered are listed, but only abnormal results are displayed) Labs Reviewed  CBC WITH DIFFERENTIAL/PLATELET - Abnormal; Notable for the following components:      Result Value   WBC 14.0 (*)    Neutro Abs 11.3 (*)    Monocytes Absolute 1.2 (*)    All other components within normal limits  COMPREHENSIVE METABOLIC PANEL - Abnormal; Notable for the following components:   Total Bilirubin 0.2 (*)    All other components within normal limits  LIPASE, BLOOD - Abnormal; Notable for the following components:   Lipase 397 (*)    All other components within normal limits  HCG, QUANTITATIVE, PREGNANCY  URINALYSIS, ROUTINE W REFLEX MICROSCOPIC    EKG EKG Interpretation  Date/Time:  Wednesday November 26 2020 07:58:03 EDT Ventricular Rate:  66 PR Interval:  135 QRS Duration: 101 QT Interval:  376 QTC Calculation: 394 R Axis:   52 Text Interpretation: Sinus rhythm Atrial premature complex since last tracing no significant change Confirmed by [id number] [phone]) on 11/26/2020 8:31:45 AM  Radiology No results found.  Procedures Procedures   Medications Ordered in ED Medications  HYDROmorphone (DILAUDID) injection 1 mg (has no administration in time range)  ondansetron (ZOFRAN) injection 4 mg (4 mg Intravenous Given 11/26/20 0821)  sodium chloride 0.9 % bolus 1,000 mL (0 mLs Intravenous Stopped 11/26/20 0916)  HYDROmorphone (DILAUDID) injection 1 mg (1 mg Intravenous Given 11/26/20 0824)   morphine 4 MG/ML injection 4 mg (4 mg Intravenous Given 11/26/20 1136)  oxyCODONE (Oxy IR/ROXICODONE) immediate release tablet 10 mg (10 mg Oral Given 11/26/20 1230)    ED Course  I have reviewed the triage vital signs and the nursing notes.  Pertinent labs & imaging results that were available during my care of the patient were reviewed by me and considered in my medical decision making (see chart for details).  Clinical Course as of 11/26/20 1302  Wed Nov 26, 2020  2657 25 year old female with history of pancreatitis presents with pain, similar to prior episodes.  On exam, found have generalized tenderness. EKG due to complaint of chest tightness, no acute changes. Patient was given Dilaudid and IV fluids.  She states that if like to take the edge  of the pain she is able to take a nap however upon waking was having return of pain and requesting higher dose of pain medication.  Patient was given dose of morphine with plan for discharge home.  Reports controlled/improved pain but requesting additional pain medication prior to discharge, given p.o. dose of oxycodone.  Database reviewed, no recent narcotic prescriptions on file, given short course of oxycodone to take as needed as prescribed.  Plan for patient to follow-up with her treatment team.  Review of labs, mildly elevated white blood cell count at 14,000, lipase today is 397, patient feels reassured by this, states that she typically has a lipase closer to 1000 when she is having a severe flare.  Patient would like to be discharged at this time, given return to ER precautions. Is discussed with Dr. Effie Shy, ER attending, agrees with plan of care. [LM]    Clinical Course User Index [LM] Alden Hipp   MDM Rules/Calculators/A&P                           Final Clinical Impression(s) / ED Diagnoses Final diagnoses:  Acute pancreatitis, unspecified complication status, unspecified pancreatitis type    Rx / DC Orders ED Discharge  Orders          Ordered    oxyCODONE (ROXICODONE) 5 MG immediate release tablet  Every 4 hours PRN        11/26/20 1132             Jeannie Fend, PA-C 11/26/20 1302    Mancel Bale, MD 11/26/20 1657

## 2020-11-26 NOTE — ED Triage Notes (Signed)
Pt bib from home by [facility] d/t abdominal pain. Pt woke at 0400 with abdominal pain.  Reported to EMS hx of pancreatitis w/ hospitalization in the past, stating this felt similar.  Pt reports vomiting bile. Has not been able to keep food or drink down since 0400.

## 2020-12-21 ENCOUNTER — Other Ambulatory Visit (HOSPITAL_COMMUNITY): Payer: Self-pay | Admitting: Psychiatry

## 2021-01-12 ENCOUNTER — Other Ambulatory Visit: Payer: Self-pay

## 2021-01-12 ENCOUNTER — Ambulatory Visit (HOSPITAL_COMMUNITY)
Admission: EM | Admit: 2021-01-12 | Discharge: 2021-01-12 | Disposition: A | Discharge status: Home / Self Care | Payer: No Typology Code available for payment source | Attending: Emergency Medicine | Admitting: Emergency Medicine

## 2021-01-12 ENCOUNTER — Encounter (HOSPITAL_COMMUNITY): Payer: Self-pay | Admitting: Emergency Medicine

## 2021-01-12 DIAGNOSIS — J069 Acute upper respiratory infection, unspecified: Secondary | ICD-10-CM

## 2021-01-12 DIAGNOSIS — Z20822 Contact with and (suspected) exposure to covid-19: Secondary | ICD-10-CM | POA: Insufficient documentation

## 2021-01-12 DIAGNOSIS — J029 Acute pharyngitis, unspecified: Secondary | ICD-10-CM | POA: Insufficient documentation

## 2021-01-12 NOTE — ED Provider Notes (Signed)
[facility]    CSN: [id number] Arrival date & time: 01/12/21  2022      History   Chief Complaint Chief Complaint  Patient presents with   Cough   Sore Throat    HPI Shannon Holloway is a 25 y.o. female.   Patient here for evaluation of cough, congestion, and sore throat that has been ongoing since Saturday.  Reports at home COVID test was negative but states job is requesting a PCR test.  Reports taking DayQuil with some symptom relief.  Denies any known sick contacts.  Denies any trauma, injury, or other precipitating event.  Denies any specific alleviating or aggravating factors.  Denies any fevers, chest pain, shortness of breath, N/V/D, numbness, tingling, weakness, abdominal pain, or headaches.    The history is provided by the patient.  Cough Associated symptoms: sore throat   Sore Throat   Past Medical History:  Diagnosis Date   ADHD (attention deficit hyperactivity disorder)    Anxiety    Depression    Pancreatitis 2012    Patient Active Problem List   Diagnosis Date Noted   Pancreatitis 08/17/2020   Tobacco abuse 08/17/2020   History of panic attacks 05/19/2016   Major depressive disorder 05/19/2016    Past Surgical History:  Procedure Laterality Date   CHOLECYSTECTOMY  2012   HERNIA REPAIR  2001    OB History   No obstetric history on file.      Home Medications    Prior to Admission medications   Medication Sig Start Date End Date Taking? Authorizing Provider  escitalopram (LEXAPRO) 10 MG tablet Take 1 tablet (10 mg total) by mouth daily. 08/08/20   Thresa Ross, MD  oxyCODONE (ROXICODONE) 5 MG immediate release tablet Take 1 tablet (5 mg total) by mouth every 4 (four) hours as needed for severe pain. 11/26/20   Jeannie Fend, PA-C  buPROPion (WELLBUTRIN XL) 150 MG 24 hr tablet Take 1 tablet (150 mg total) by mouth daily. Patient not taking: Reported on 12/21/2018 02/24/17 12/21/18  Thresa Ross, MD  promethazine (PHENERGAN)  25 MG tablet Take 1 tablet (25 mg total) by mouth every 6 (six) hours as needed for nausea or vomiting. Patient not taking: Reported on 12/21/2018 03/12/17 12/21/18  Lawyer, Cristal Deer, PA-C  VYVANSE 20 MG capsule Take 1 capsule (20 mg total) by mouth every morning. Patient not taking: Reported on 11/24/2018 11/23/16 12/21/18  Thresa Ross, MD    Family History Family History  Problem Relation Age of Onset   Anxiety disorder Mother    Anxiety disorder Maternal Grandmother     Social History Social History   Tobacco Use   Smoking status: Never   Smokeless tobacco: Never  Substance Use Topics   Alcohol use: Yes    Comment: social   Drug use: Never     Allergies   Patient has no known allergies.   Review of Systems Review of Systems  HENT:  Positive for congestion and sore throat.   Respiratory:  Positive for cough.   All other systems reviewed and are negative.   Physical Exam Triage Vital Signs ED Triage Vitals  Enc Vitals Group     BP 01/12/21 2030 (!) 150/98     Pulse Rate 01/12/21 2030 93     Resp 01/12/21 2030 17     Temp 01/12/21 2030 98.9 F (37.2 C)     Temp Source 01/12/21 2030 Oral     SpO2 01/12/21 2030 97 %  Weight --      Height --      Head Circumference --      Peak Flow --      Pain Score 01/12/21 2028 2     Pain Loc --      Pain Edu? --      Excl. in GC? --    No data found.  Updated Vital Signs BP (!) 150/98 (BP Location: Left Arm)   Pulse 93   Temp 98.9 F (37.2 C) (Oral)   Resp 17   LMP 12/26/2020   SpO2 97%   Visual Acuity Right Eye Distance:   Left Eye Distance:   Bilateral Distance:    Right Eye Near:   Left Eye Near:    Bilateral Near:     Physical Exam Vitals and nursing note reviewed.  Constitutional:      General: She is not in acute distress.    Appearance: Normal appearance. She is not ill-appearing, toxic-appearing or diaphoretic.  HENT:     Head: Normocephalic and atraumatic.     Right Ear: Tympanic  membrane and ear canal normal.     Left Ear: Tympanic membrane and ear canal normal.     Nose: Congestion present. No rhinorrhea.     Mouth/Throat:     Pharynx: Uvula midline. Pharyngeal swelling and posterior oropharyngeal erythema present.     Tonsils: No tonsillar exudate or tonsillar abscesses. 1+ on the right. 1+ on the left.  Eyes:     Conjunctiva/sclera: Conjunctivae normal.  Cardiovascular:     Rate and Rhythm: Normal rate and regular rhythm.     Pulses: Normal pulses.     Heart sounds: Normal heart sounds.  Pulmonary:     Effort: Pulmonary effort is normal.     Breath sounds: Normal breath sounds.  Abdominal:     General: Abdomen is flat.  Musculoskeletal:        General: Normal range of motion.     Cervical back: Normal range of motion.  Skin:    General: Skin is warm and dry.  Neurological:     General: No focal deficit present.     Mental Status: She is alert and oriented to person, place, and time.  Psychiatric:        Mood and Affect: Mood normal.     UC Treatments / Results  Labs (all labs ordered are listed, but only abnormal results are displayed) Labs Reviewed  SARS CORONAVIRUS 2 (TAT 6-24 HRS)    EKG   Radiology No results found.  Procedures Procedures (including critical care time)  Medications Ordered in UC Medications - No data to display  Initial Impression / Assessment and Plan / UC Course  I have reviewed the triage vital signs and the nursing notes.  Pertinent labs & imaging results that were available during my care of the patient were reviewed by me and considered in my medical decision making (see chart for details).    Assessment negative for red flags or concerns.  Likely viral URI.  COVID test pending.  Tylenol and or ibuprofen as needed.  Encourage fluids and rest.  Discussed conservative symptom management as described in discharge instructions.  Patient given work note.  Follow-up as needed Final Clinical Impressions(s) / UC  Diagnoses   Final diagnoses:  Viral URI     Discharge Instructions      You can take Tylenol and/or Ibuprofen as needed for fever reduction and pain relief.   For cough: honey  1/2 to 1 teaspoon (you can dilute the honey in water or another fluid).  You can also use guaifenesin and dextromethorphan for cough. You can use a humidifier for chest congestion and cough.  If you don't have a humidifier, you can sit in the bathroom with the hot shower running.     For sore throat: try warm salt water gargles, cepacol lozenges, throat spray, warm tea or water with lemon/honey, popsicles or ice, or OTC cold relief medicine for throat discomfort.    For congestion: take a daily anti-histamine like Zyrtec, Claritin, and a oral decongestant, such as pseudoephedrine.  You can also use Flonase 1-2 sprays in each nostril daily.    It is important to stay hydrated: drink plenty of fluids (water, gatorade/powerade/pedialyte, juices, or teas) to keep your throat moisturized and help further relieve irritation/discomfort.   Return or go to the Emergency Department if symptoms worsen or do not improve in the next few days.      ED Prescriptions   None    PDMP not reviewed this encounter.   Ivette Loyal, NP 01/12/21 2038

## 2021-01-12 NOTE — Discharge Instructions (Signed)

## 2021-01-12 NOTE — ED Triage Notes (Signed)
Pt had cough, congestion, sore throat since Saturday. Had home covid test that was negative. Taking Dayquil.

## 2021-01-13 LAB — SARS CORONAVIRUS 2 (TAT 6-24 HRS): SARS Coronavirus 2: NEGATIVE

## 2021-01-17 ENCOUNTER — Inpatient Hospital Stay (HOSPITAL_BASED_OUTPATIENT_CLINIC_OR_DEPARTMENT_OTHER)
Admission: EM | Admit: 2021-01-17 | Discharge: 2021-01-20 | DRG: 440 | Disposition: A | Discharge status: Home / Self Care | Payer: No Typology Code available for payment source | Attending: Internal Medicine | Admitting: Internal Medicine

## 2021-01-17 ENCOUNTER — Other Ambulatory Visit: Payer: Self-pay

## 2021-01-17 ENCOUNTER — Emergency Department (HOSPITAL_BASED_OUTPATIENT_CLINIC_OR_DEPARTMENT_OTHER): Payer: No Typology Code available for payment source

## 2021-01-17 ENCOUNTER — Emergency Department (HOSPITAL_COMMUNITY)
Admission: EM | Admit: 2021-01-17 | Discharge: 2021-01-17 | Discharge status: Against Medical Advice | Payer: No Typology Code available for payment source

## 2021-01-17 ENCOUNTER — Encounter (HOSPITAL_BASED_OUTPATIENT_CLINIC_OR_DEPARTMENT_OTHER): Payer: Self-pay

## 2021-01-17 DIAGNOSIS — K859 Acute pancreatitis without necrosis or infection, unspecified: Principal | ICD-10-CM | POA: Diagnosis present

## 2021-01-17 DIAGNOSIS — K9189 Other postprocedural complications and disorders of digestive system: Secondary | ICD-10-CM | POA: Diagnosis present

## 2021-01-17 DIAGNOSIS — Z20822 Contact with and (suspected) exposure to covid-19: Secondary | ICD-10-CM | POA: Diagnosis present

## 2021-01-17 DIAGNOSIS — F909 Attention-deficit hyperactivity disorder, unspecified type: Secondary | ICD-10-CM | POA: Diagnosis present

## 2021-01-17 DIAGNOSIS — F419 Anxiety disorder, unspecified: Secondary | ICD-10-CM | POA: Diagnosis present

## 2021-01-17 DIAGNOSIS — K8681 Exocrine pancreatic insufficiency: Secondary | ICD-10-CM | POA: Diagnosis present

## 2021-01-17 DIAGNOSIS — K861 Other chronic pancreatitis: Secondary | ICD-10-CM | POA: Diagnosis present

## 2021-01-17 DIAGNOSIS — Z818 Family history of other mental and behavioral disorders: Secondary | ICD-10-CM

## 2021-01-17 DIAGNOSIS — Z79899 Other long term (current) drug therapy: Secondary | ICD-10-CM

## 2021-01-17 DIAGNOSIS — F32A Depression, unspecified: Secondary | ICD-10-CM | POA: Diagnosis present

## 2021-01-17 DIAGNOSIS — Z9049 Acquired absence of other specified parts of digestive tract: Secondary | ICD-10-CM

## 2021-01-17 LAB — CBC WITH DIFFERENTIAL/PLATELET
Abs Immature Granulocytes: 0.03 10*3/uL (ref 0.00–0.07)
Basophils Absolute: 0 10*3/uL (ref 0.0–0.1)
Basophils Relative: 0 %
Eosinophils Absolute: 0 10*3/uL (ref 0.0–0.5)
Eosinophils Relative: 0 %
HCT: 37.6 % (ref 36.0–46.0)
Hemoglobin: 12.3 g/dL (ref 12.0–15.0)
Immature Granulocytes: 0 %
Lymphocytes Relative: 26 %
Lymphs Abs: 3.3 10*3/uL (ref 0.7–4.0)
MCH: 29.4 pg (ref 26.0–34.0)
MCHC: 32.7 g/dL (ref 30.0–36.0)
MCV: 90 fL (ref 80.0–100.0)
Monocytes Absolute: 0.9 10*3/uL (ref 0.1–1.0)
Monocytes Relative: 7 %
Neutro Abs: 8.3 10*3/uL — ABNORMAL HIGH (ref 1.7–7.7)
Neutrophils Relative %: 67 %
Platelets: 261 10*3/uL (ref 150–400)
RBC: 4.18 MIL/uL (ref 3.87–5.11)
RDW: 12.4 % (ref 11.5–15.5)
WBC: 12.5 10*3/uL — ABNORMAL HIGH (ref 4.0–10.5)
nRBC: 0 % (ref 0.0–0.2)

## 2021-01-17 LAB — COMPREHENSIVE METABOLIC PANEL
ALT: 18 U/L (ref 0–44)
AST: 20 U/L (ref 15–41)
Albumin: 4.2 g/dL (ref 3.5–5.0)
Alkaline Phosphatase: 63 U/L (ref 38–126)
Anion gap: 9 (ref 5–15)
BUN: 10 mg/dL (ref 6–20)
CO2: 23 mmol/L (ref 22–32)
Calcium: 8.9 mg/dL (ref 8.9–10.3)
Chloride: 102 mmol/L (ref 98–111)
Creatinine, Ser: 0.8 mg/dL (ref 0.44–1.00)
GFR, Estimated: >60 mL/min (ref >60)
Glucose, Bld: 94 mg/dL (ref 70–99)
Potassium: 3.7 mmol/L (ref 3.5–5.1)
Sodium: 134 mmol/L — ABNORMAL LOW (ref 135–145)
Total Bilirubin: 0.5 mg/dL (ref 0.3–1.2)
Total Protein: 7.9 g/dL (ref 6.5–8.1)

## 2021-01-17 LAB — LIPASE, BLOOD: Lipase: 168 U/L — ABNORMAL HIGH (ref 11–51)

## 2021-01-17 MED ORDER — HYDROMORPHONE HCL 1 MG/ML IJ SOLN
1.0000 mg | INTRAMUSCULAR | Status: DC | PRN
  Administered 2021-01-18 (×3): 1 mg via INTRAVENOUS
  Filled 2021-01-17 (×3): qty 1

## 2021-01-17 MED ORDER — SODIUM CHLORIDE 0.9 % IV BOLUS
500.0000 mL | Freq: Once | INTRAVENOUS | Status: AC
  Administered 2021-01-17: 500 mL via INTRAVENOUS

## 2021-01-17 MED ORDER — HYDROMORPHONE HCL 1 MG/ML IJ SOLN
0.5000 mg | Freq: Once | INTRAMUSCULAR | Status: AC
  Administered 2021-01-17: 0.5 mg via INTRAVENOUS
  Filled 2021-01-17: qty 1

## 2021-01-17 MED ORDER — IOHEXOL 300 MG/ML  SOLN
100.0000 mL | Freq: Once | INTRAMUSCULAR | Status: AC | PRN
  Administered 2021-01-17: 100 mL via INTRAVENOUS

## 2021-01-17 MED ORDER — HYDROMORPHONE HCL 1 MG/ML IJ SOLN
0.5000 mg | Freq: Once | INTRAMUSCULAR | Status: AC
Start: 2021-01-17 — End: 2021-01-17
  Administered 2021-01-17: 0.5 mg via INTRAVENOUS
  Filled 2021-01-17: qty 1

## 2021-01-17 MED ORDER — ONDANSETRON HCL 4 MG/2ML IJ SOLN
4.0000 mg | Freq: Once | INTRAMUSCULAR | Status: AC
  Administered 2021-01-17: 4 mg via INTRAVENOUS
  Filled 2021-01-17: qty 2

## 2021-01-17 MED ORDER — LACTATED RINGERS IV SOLN: INTRAVENOUS | Status: DC

## 2021-01-17 NOTE — ED Triage Notes (Signed)
Pt a/ox4, heaving into bag, states she was t [facility] yesterday for ERCP for chronic pancreatitis and was not given pain or nausea meds at DC. Upon arriving home, pt has been having consistent 7/10 RUQ/epigastric abd pain with n/v

## 2021-01-17 NOTE — ED Notes (Signed)
Patient transported to CT 

## 2021-01-17 NOTE — ED Provider Notes (Signed)
[facility] EMERGENCY DEPARTMENT Provider Note   CSN: [id number] Arrival date & time: 01/17/21  1916     History Chief Complaint  Patient presents with   Abdominal Pain    N/v    Shannon Holloway is a 25 y.o. female.   Abdominal Pain Associated symptoms: nausea and vomiting   Associated symptoms: no chest pain and no shortness of breath   Patient presents with recurrent abdominal pain.  Had a ERCP yesterday.  Had pancreatic stent placed.  States during recovery she began having abdominal pain.  Pain was like her previous pancreatitis.  States she went home and was not given nausea or pain medicines.  States pain is continued.  Still having nausea and vomiting.  Pain is in the upper abdomen.  Similar to previous pancreatitis.  No fevers.  No chills.  No blood in the emesis.  Denies possibly of pregnancy.    Past Medical History:  Diagnosis Date   ADHD (attention deficit hyperactivity disorder)    Anxiety    Depression    Pancreatitis 2012    Patient Active Problem List   Diagnosis Date Noted   Acute pancreatitis after endoscopic retrograde cholangiopancreatography (ERCP) 01/17/2021   Pancreatitis 08/17/2020   Tobacco abuse 08/17/2020   History of panic attacks 05/19/2016   Major depressive disorder 05/19/2016    Past Surgical History:  Procedure Laterality Date   CHOLECYSTECTOMY  2012   HERNIA REPAIR  2001     OB History   No obstetric history on file.     Family History  Problem Relation Age of Onset   Anxiety disorder Mother    Anxiety disorder Maternal Grandmother     Social History   Tobacco Use   Smoking status: Never   Smokeless tobacco: Never  Substance Use Topics   Alcohol use: Yes    Comment: social   Drug use: Yes    Types: Marijuana    Home Medications Prior to Admission medications   Medication Sig Start Date End Date Taking? Authorizing Provider  escitalopram (LEXAPRO) 10 MG tablet Take 1 tablet (10 mg total) by mouth  daily. 08/08/20   Thresa Ross, MD  oxyCODONE (ROXICODONE) 5 MG immediate release tablet Take 1 tablet (5 mg total) by mouth every 4 (four) hours as needed for severe pain. 11/26/20   Jeannie Fend, PA-C  buPROPion (WELLBUTRIN XL) 150 MG 24 hr tablet Take 1 tablet (150 mg total) by mouth daily. Patient not taking: Reported on 12/21/2018 02/24/17 12/21/18  Thresa Ross, MD  promethazine (PHENERGAN) 25 MG tablet Take 1 tablet (25 mg total) by mouth every 6 (six) hours as needed for nausea or vomiting. Patient not taking: Reported on 12/21/2018 03/12/17 12/21/18  Lawyer, Cristal Deer, PA-C  VYVANSE 20 MG capsule Take 1 capsule (20 mg total) by mouth every morning. Patient not taking: Reported on 11/24/2018 11/23/16 12/21/18  Thresa Ross, MD    Allergies    Patient has no known allergies.  Review of Systems   Review of Systems  Constitutional:  Positive for appetite change.  HENT:  Negative for congestion.   Respiratory:  Negative for shortness of breath.   Cardiovascular:  Negative for chest pain.  Gastrointestinal:  Positive for abdominal pain, nausea and vomiting.  Genitourinary:  Negative for flank pain.  Musculoskeletal:  Negative for back pain.  Skin:  Negative for rash.  Neurological:  Negative for light-headedness.  Psychiatric/Behavioral:  Negative for confusion.    Physical Exam Updated Vital Signs BP 136/85  Pulse 73   Temp 98.8 F (37.1 C) (Oral)   Resp 18   Ht 5\' 7"  (1.702 m)   Wt 90.7 kg   LMP 12/26/2020   SpO2 99%   BMI 31.32 kg/m   Physical Exam Vitals and nursing note reviewed.  Constitutional:      Comments: Patient appears somewhat uncomfortable.  HENT:     Head: Normocephalic.  Cardiovascular:     Rate and Rhythm: Normal rate and regular rhythm.  Abdominal:     Hernia: No hernia is present.     Comments: Upper abdominal tenderness without rebound or guarding.  No hernia palpated.  Skin:    General: Skin is warm.  Neurological:     Mental Status: She  is alert and oriented to person, place, and time.    ED Results / Procedures / Treatments   Labs (all labs ordered are listed, but only abnormal results are displayed) Labs Reviewed  LIPASE, BLOOD - Abnormal; Notable for the following components:      Result Value   Lipase 168 (*)    All other components within normal limits  COMPREHENSIVE METABOLIC PANEL - Abnormal; Notable for the following components:   Sodium 134 (*)    All other components within normal limits  CBC WITH DIFFERENTIAL/PLATELET - Abnormal; Notable for the following components:   WBC 12.5 (*)    Neutro Abs 8.3 (*)    All other components within normal limits  RESP PANEL BY RT-PCR (FLU A&B, COVID) [id number]    EKG None  Radiology CT ABDOMEN PELVIS W CONTRAST  Result Date: 01/17/2021 CLINICAL DATA:  Abdominal pain, nausea/vomiting, history of pancreatitis. ERCP yesterday. EXAM: CT ABDOMEN AND PELVIS WITH CONTRAST TECHNIQUE: Multidetector CT imaging of the abdomen and pelvis was performed using the standard protocol following bolus administration of intravenous contrast. CONTRAST:  01/19/2021 OMNIPAQUE IOHEXOL 300 MG/ML  SOLN COMPARISON:  MRI abdomen dated 08/19/2020. CT abdomen dated 12/04/2019. FINDINGS: Lower chest: Lung bases are clear. Hepatobiliary: Liver is within normal limits. Status post cholecystectomy. No intrahepatic or extrahepatic ductal dilatation. Pancreas: Stable prominence of the pancreatic head/uncinate process with atrophy of the pancreatic body/tail. Indwelling pancreatic drain with pigtail in the proximal duodenum. No pancreatic ductal dilatation. Stable 13 mm peripancreatic cyst anteriorly along the pancreatic head (series 2/image 31), favoring a pseudocyst. Mild inflammatory changes along the pancreaticoduodenal groove (series 2/image 37), possibly reflecting mild acute pancreatitis, although this may also be postprocedural. Spleen: Within normal limits. Adrenals/Urinary Tract: Adrenal glands are within  normal limits. Kidneys are within normal limits.  No hydronephrosis. Bladder is within normal limits. Stomach/Bowel: Stomach is within normal limits. No evidence of bowel obstruction. Appendix is not well visualized. Vascular/Lymphatic: No evidence of abdominal aortic aneurysm. No suspicious abdominopelvic lymphadenopathy. Reproductive: Uterus is within normal limits. Bilateral ovaries are within normal limits. Other: Small volume pelvic ascites. No free air. Musculoskeletal: Visualized osseous structures are within normal limits. IMPRESSION: Status post ERCP with pancreatic drain placement. Mild peripancreatic inflammatory changes along the pancreaticoduodenal groove, possibly reflecting mild acute pancreatitis, although postprocedural changes are also possible. Small volume pelvic ascites.  No free air. Electronically Signed   By: 12/06/2019 M.D.   On: 01/17/2021 21:19    Procedures Procedures   Medications Ordered in ED Medications  lactated ringers infusion ( Intravenous New Bag/Given 01/17/21 2308)  HYDROmorphone (DILAUDID) injection 1 mg (has no administration in time range)  HYDROmorphone (DILAUDID) injection 0.5 mg (0.5 mg Intravenous Given 01/17/21 2048)  sodium chloride 0.9 %  bolus 500 mL (0 mLs Intravenous Stopped 01/17/21 2214)  ondansetron (ZOFRAN) injection 4 mg (4 mg Intravenous Given 01/17/21 2046)  iohexol (OMNIPAQUE) 300 MG/ML solution 100 mL (100 mLs Intravenous Contrast Given 01/17/21 2055)  HYDROmorphone (DILAUDID) injection 0.5 mg (0.5 mg Intravenous Given 01/17/21 2219)    ED Course  I have reviewed the triage vital signs and the nursing notes.  Pertinent labs & imaging results that were available during my care of the patient were reviewed by me and considered in my medical decision making (see chart for details).    MDM Rules/Calculators/A&P                           Patient presents with abdominal pain.  Had ERCP done yesterday for chronic pancreatitis.  Had  stent placed.  Was done at [facility] however.  Has been having pain after.  Continues to have pain.  Lipase is now elevated.  CT scan done and shows likely patent stent.  LFTs otherwise reassuring.  Afebrile.  Doubt an ascending cholangitis.  CT scan done and showed pancreatitis.  Likely postprocedural.  Patient request however that she stay at [facility].  States it is closer to her family and she does not want to transfer to [facility].  Does not appear as if she would need any acute GI intervention at this time.  I think it is reasonable for her to stay particular with the shortage of beds for transfer.  Will discuss with hospitalist.  Final Clinical Impression(s) / ED Diagnoses Final diagnoses:  Acute pancreatitis, unspecified complication status, unspecified pancreatitis type    Rx / DC Orders ED Discharge Orders     None        Benjiman Core, MD 01/17/21 2350

## 2021-01-17 NOTE — ED Notes (Signed)
Pt left after checking in

## 2021-01-17 NOTE — Plan of Care (Addendum)
25 yo F with h/o pancreatitis, pancreas divisum.  Just had ERCP with pancreatic drain placement yesterday at [facility].  In to ED today with abd pain.  CT scan shows mild acute pancreatitis.  Doesn't look like anything majorly wrong with the drain.  Per EDP: Pt refused transfer to [facility] at this time, just wants to be admitted to [facility].  Will go ahead and put in for med-surg bed at [facility].  Dont really see anything emergent surgical that MUST be transferred to [facility] based on CT scan.  [facility] will assume care on arrival to accepting facility. Until arrival, care as per EDP. However, [facility] available 24/7 for questions and assistance.  Nursing staff, please page [facility] Admits and Consults [phone]) as soon as the patient arrives the hospital.

## 2021-01-18 ENCOUNTER — Encounter (HOSPITAL_COMMUNITY): Payer: Self-pay | Admitting: Internal Medicine

## 2021-01-18 DIAGNOSIS — F909 Attention-deficit hyperactivity disorder, unspecified type: Secondary | ICD-10-CM | POA: Diagnosis present

## 2021-01-18 DIAGNOSIS — K9189 Other postprocedural complications and disorders of digestive system: Secondary | ICD-10-CM | POA: Diagnosis not present

## 2021-01-18 DIAGNOSIS — Z9049 Acquired absence of other specified parts of digestive tract: Secondary | ICD-10-CM | POA: Diagnosis not present

## 2021-01-18 DIAGNOSIS — K859 Acute pancreatitis without necrosis or infection, unspecified: Secondary | ICD-10-CM | POA: Diagnosis present

## 2021-01-18 DIAGNOSIS — Z818 Family history of other mental and behavioral disorders: Secondary | ICD-10-CM | POA: Diagnosis not present

## 2021-01-18 DIAGNOSIS — Z79899 Other long term (current) drug therapy: Secondary | ICD-10-CM | POA: Diagnosis not present

## 2021-01-18 DIAGNOSIS — K861 Other chronic pancreatitis: Secondary | ICD-10-CM | POA: Diagnosis present

## 2021-01-18 DIAGNOSIS — F32A Depression, unspecified: Secondary | ICD-10-CM | POA: Diagnosis present

## 2021-01-18 DIAGNOSIS — F419 Anxiety disorder, unspecified: Secondary | ICD-10-CM | POA: Diagnosis present

## 2021-01-18 DIAGNOSIS — Z20822 Contact with and (suspected) exposure to covid-19: Secondary | ICD-10-CM | POA: Diagnosis present

## 2021-01-18 DIAGNOSIS — K8681 Exocrine pancreatic insufficiency: Secondary | ICD-10-CM | POA: Diagnosis present

## 2021-01-18 LAB — COMPREHENSIVE METABOLIC PANEL
ALT: 12 U/L (ref 0–44)
AST: 13 U/L — ABNORMAL LOW (ref 15–41)
Albumin: 3.4 g/dL — ABNORMAL LOW (ref 3.5–5.0)
Alkaline Phosphatase: 53 U/L (ref 38–126)
Anion gap: 3 — ABNORMAL LOW (ref 5–15)
BUN: 9 mg/dL (ref 6–20)
CO2: 28 mmol/L (ref 22–32)
Calcium: 8.5 mg/dL — ABNORMAL LOW (ref 8.9–10.3)
Chloride: 105 mmol/L (ref 98–111)
Creatinine, Ser: 0.78 mg/dL (ref 0.44–1.00)
GFR, Estimated: >60 mL/min (ref >60)
Glucose, Bld: 79 mg/dL (ref 70–99)
Potassium: 4 mmol/L (ref 3.5–5.1)
Sodium: 136 mmol/L (ref 135–145)
Total Bilirubin: 0.5 mg/dL (ref 0.3–1.2)
Total Protein: 6.5 g/dL (ref 6.5–8.1)

## 2021-01-18 LAB — CBC WITH DIFFERENTIAL/PLATELET
Abs Immature Granulocytes: 0.02 10*3/uL (ref 0.00–0.07)
Basophils Absolute: 0 10*3/uL (ref 0.0–0.1)
Basophils Relative: 0 %
Eosinophils Absolute: 0 10*3/uL (ref 0.0–0.5)
Eosinophils Relative: 1 %
HCT: 34 % — ABNORMAL LOW (ref 36.0–46.0)
Hemoglobin: 10.8 g/dL — ABNORMAL LOW (ref 12.0–15.0)
Immature Granulocytes: 0 %
Lymphocytes Relative: 40 %
Lymphs Abs: 3.4 10*3/uL (ref 0.7–4.0)
MCH: 29.2 pg (ref 26.0–34.0)
MCHC: 31.8 g/dL (ref 30.0–36.0)
MCV: 91.9 fL (ref 80.0–100.0)
Monocytes Absolute: 0.6 10*3/uL (ref 0.1–1.0)
Monocytes Relative: 7 %
Neutro Abs: 4.4 10*3/uL (ref 1.7–7.7)
Neutrophils Relative %: 52 %
Platelets: 209 10*3/uL (ref 150–400)
RBC: 3.7 MIL/uL — ABNORMAL LOW (ref 3.87–5.11)
RDW: 12.4 % (ref 11.5–15.5)
WBC: 8.5 10*3/uL (ref 4.0–10.5)
nRBC: 0 % (ref 0.0–0.2)

## 2021-01-18 LAB — RESP PANEL BY RT-PCR (FLU A&B, COVID) ARPGX2
Influenza A by PCR: NEGATIVE
Influenza B by PCR: NEGATIVE
SARS Coronavirus 2 by RT PCR: NEGATIVE

## 2021-01-18 MED ORDER — LIP MEDEX EX OINT
TOPICAL_OINTMENT | CUTANEOUS | Status: AC
  Filled 2021-01-18: qty 7

## 2021-01-18 MED ORDER — PROMETHAZINE HCL 25 MG PO TABS: 12.5000 mg | ORAL_TABLET | Freq: Four times a day (QID) | ORAL | Status: DC | PRN

## 2021-01-18 MED ORDER — DOCUSATE SODIUM 100 MG PO CAPS
100.0000 mg | ORAL_CAPSULE | Freq: Two times a day (BID) | ORAL | Status: DC
  Administered 2021-01-18 – 2021-01-20 (×4): 100 mg via ORAL
  Filled 2021-01-18 (×4): qty 1

## 2021-01-18 MED ORDER — ONDANSETRON HCL 4 MG/2ML IJ SOLN
4.0000 mg | Freq: Once | INTRAMUSCULAR | Status: AC
  Administered 2021-01-18: 4 mg via INTRAVENOUS
  Filled 2021-01-18: qty 2

## 2021-01-18 MED ORDER — INFLUENZA VAC SPLIT QUAD 0.5 ML IM SUSY: 0.5000 mL | PREFILLED_SYRINGE | INTRAMUSCULAR | Status: DC

## 2021-01-18 MED ORDER — OXYCODONE HCL 5 MG PO TABS
5.0000 mg | ORAL_TABLET | ORAL | Status: DC | PRN
  Administered 2021-01-19 – 2021-01-20 (×4): 5 mg via ORAL
  Filled 2021-01-18 (×4): qty 1

## 2021-01-18 MED ORDER — ACETAMINOPHEN 325 MG PO TABS: 650.0000 mg | ORAL_TABLET | Freq: Four times a day (QID) | ORAL | Status: DC | PRN

## 2021-01-18 MED ORDER — PROCHLORPERAZINE EDISYLATE 10 MG/2ML IJ SOLN
10.0000 mg | Freq: Four times a day (QID) | INTRAMUSCULAR | Status: DC | PRN
  Administered 2021-01-18: 10 mg via INTRAVENOUS
  Filled 2021-01-18: qty 2

## 2021-01-18 MED ORDER — ACETAMINOPHEN 650 MG RE SUPP: 650.0000 mg | Freq: Four times a day (QID) | RECTAL | Status: DC | PRN

## 2021-01-18 MED ORDER — HYDROMORPHONE HCL 1 MG/ML IJ SOLN
1.0000 mg | INTRAMUSCULAR | Status: DC | PRN
Start: 2021-01-18 — End: 2021-01-20
  Administered 2021-01-18 – 2021-01-20 (×5): 1 mg via INTRAVENOUS
  Filled 2021-01-18 (×5): qty 1

## 2021-01-18 NOTE — H&P (Signed)
History and Physical    [name] QMV:[id number] DOB: 1996/01/09 DOA: 01/17/2021  PCP: Shannon Able, MD  Patient coming from: Home  Chief Complaint: abdominal pain  HPI: Mariaha Holloway is a 25 y.o. female with medical history significant of chronic pancreatitis, pancreatic divisum. Presenting with abdominal pain, N/V. Patient has an ERCP at [facility] 2 days ago. A stent was placed. She was discharged without incident. Upon arriving at home, she began having pain that was not controlled with APAP. She had N/V. She spoke to her GI doc who recommended that she come to the ED for evaluation. She denies any other aggravating or alleviating factors.   ED Course: CT ab/pelvis showed mild pancreatitis. Pt was started on pain control, anti-emetics, fluids. [facility] was called for admission.   Review of Systems:  Denies CP, palpitations, dyspnea, diarrhea, fever, lightheadedness, dizziness. Review of systems is otherwise negative for all not mentioned in HPI.   PMHx Past Medical History:  Diagnosis Date   ADHD (attention deficit hyperactivity disorder)    Anxiety    Depression    Pancreatitis 2012    PSHx Past Surgical History:  Procedure Laterality Date   CHOLECYSTECTOMY  2012   HERNIA REPAIR  2001    SocHx  reports that she has never smoked. She has never used smokeless tobacco. She reports current alcohol use. She reports current drug use. Drug: Marijuana.  No Known Allergies  FamHx Family History  Problem Relation Age of Onset   Anxiety disorder Mother    Anxiety disorder Maternal Grandmother     Prior to Admission medications   Medication Sig Start Date End Date Taking? Authorizing Provider  escitalopram (LEXAPRO) 10 MG tablet Take 1 tablet (10 mg total) by mouth daily. 08/08/20   Thresa Ross, MD  oxyCODONE (ROXICODONE) 5 MG immediate release tablet Take 1 tablet (5 mg total) by mouth every 4 (four) hours as needed for severe pain. 11/26/20   Jeannie Fend, PA-C   buPROPion (WELLBUTRIN XL) 150 MG 24 hr tablet Take 1 tablet (150 mg total) by mouth daily. Patient not taking: Reported on 12/21/2018 02/24/17 12/21/18  Thresa Ross, MD  promethazine (PHENERGAN) 25 MG tablet Take 1 tablet (25 mg total) by mouth every 6 (six) hours as needed for nausea or vomiting. Patient not taking: Reported on 12/21/2018 03/12/17 12/21/18  Lawyer, Cristal Deer, PA-C  VYVANSE 20 MG capsule Take 1 capsule (20 mg total) by mouth every morning. Patient not taking: Reported on 11/24/2018 11/23/16 12/21/18  Thresa Ross, MD    Physical Exam: Vitals:   01/18/21 0615 01/18/21 0645 01/18/21 0722 01/18/21 0740  BP: 132/86 (!) 139/94  123/66  Pulse: (!) 50 (!) 48  60  Resp:    14  Temp:   98.1 F (36.7 C)   TempSrc:   Oral   SpO2: 98% 98%  100%  Weight:      Height:        General: 25 y.o. female resting in bed in NAD Eyes: PERRL, normal sclera ENMT: Nares patent w/o discharge, orophaynx clear, dentition normal, ears w/o discharge/lesions/ulcers Neck: Supple, trachea midline Cardiovascular: RRR, +S1, S2, no m/g/r, equal pulses throughout Respiratory: CTABL, no w/r/r, normal WOB GI: BS+, ND, RUQ/LUQ TTP, no masses noted, no organomegaly noted MSK: No e/c/c Skin: No rashes, bruises, ulcerations noted Neuro: A&O x 3, no focal deficits Psyc: Appropriate interaction and affect, calm/cooperative  Labs on Admission: I have personally reviewed following labs and imaging studies  CBC: Recent Labs  Lab 01/17/21  2038  WBC 12.5*  NEUTROABS 8.3*  HGB 12.3  HCT 37.6  MCV 90.0  PLT 261   Basic Metabolic Panel: Recent Labs  Lab 01/17/21 2038  NA 134*  K 3.7  CL 102  CO2 23  GLUCOSE 94  BUN 10  CREATININE 0.80  CALCIUM 8.9   GFR: Estimated Creatinine Clearance: 126.4 mL/min (by C-G formula based on SCr of 0.8 mg/dL). Liver Function Tests: Recent Labs  Lab 01/17/21 2038  AST 20  ALT 18  ALKPHOS 63  BILITOT 0.5  PROT 7.9  ALBUMIN 4.2   Recent Labs  Lab  01/17/21 2038  LIPASE 168*   No results for input(s): AMMONIA in the last 168 hours. Coagulation Profile: No results for input(s): INR, PROTIME in the last 168 hours. Cardiac Enzymes: No results for input(s): CKTOTAL, CKMB, CKMBINDEX, TROPONINI in the last 168 hours. BNP (last 3 results) No results for input(s): PROBNP in the last 8760 hours. HbA1C: No results for input(s): HGBA1C in the last 72 hours. CBG: No results for input(s): GLUCAP in the last 168 hours. Lipid Profile: No results for input(s): CHOL, HDL, LDLCALC, TRIG, CHOLHDL, LDLDIRECT in the last 72 hours. Thyroid Function Tests: No results for input(s): TSH, T4TOTAL, FREET4, T3FREE, THYROIDAB in the last 72 hours. Anemia Panel: No results for input(s): VITAMINB12, FOLATE, FERRITIN, TIBC, IRON, RETICCTPCT in the last 72 hours. Urine analysis:    Component Value Date/Time   COLORURINE YELLOW 11/26/2020 1033   APPEARANCEUR CLEAR 11/26/2020 1033   LABSPEC 1.016 11/26/2020 1033   PHURINE 6.0 11/26/2020 1033   GLUCOSEU NEGATIVE 11/26/2020 1033   HGBUR LARGE (A) 11/26/2020 1033   BILIRUBINUR NEGATIVE 11/26/2020 1033   BILIRUBINUR negative 11/22/2019 2003   KETONESUR NEGATIVE 11/26/2020 1033   PROTEINUR 30 (A) 11/26/2020 1033   UROBILINOGEN 1.0 11/22/2019 2003   UROBILINOGEN 0.2 04/03/2019 1126   NITRITE NEGATIVE 11/26/2020 1033   LEUKOCYTESUR TRACE (A) 11/26/2020 1033    Radiological Exams on Admission: CT ABDOMEN PELVIS W CONTRAST  Result Date: 01/17/2021 CLINICAL DATA:  Abdominal pain, nausea/vomiting, history of pancreatitis. ERCP yesterday. EXAM: CT ABDOMEN AND PELVIS WITH CONTRAST TECHNIQUE: Multidetector CT imaging of the abdomen and pelvis was performed using the standard protocol following bolus administration of intravenous contrast. CONTRAST:  OMNIPAQUE IOHEXOL 300 MG/ML  SOLN COMPARISON:  MRI abdomen dated 08/19/2020. CT abdomen dated 12/04/2019. FINDINGS: Lower chest: Lung bases are clear.  Hepatobiliary: Liver is within normal limits. Status post cholecystectomy. No intrahepatic or extrahepatic ductal dilatation. Pancreas: Stable prominence of the pancreatic head/uncinate process with atrophy of the pancreatic body/tail. Indwelling pancreatic drain with pigtail in the proximal duodenum. No pancreatic ductal dilatation. Stable 13 mm peripancreatic cyst anteriorly along the pancreatic head (series 2/image 31), favoring a pseudocyst. Mild inflammatory changes along the pancreaticoduodenal groove (series 2/image 37), possibly reflecting mild acute pancreatitis, although this may also be postprocedural. Spleen: Within normal limits. Adrenals/Urinary Tract: Adrenal glands are within normal limits. Kidneys are within normal limits.  No hydronephrosis. Bladder is within normal limits. Stomach/Bowel: Stomach is within normal limits. No evidence of bowel obstruction. Appendix is not well visualized. Vascular/Lymphatic: No evidence of abdominal aortic aneurysm. No suspicious abdominopelvic lymphadenopathy. Reproductive: Uterus is within normal limits. Bilateral ovaries are within normal limits. Other: Small volume pelvic ascites. No free air. Musculoskeletal: Visualized osseous structures are within normal limits. IMPRESSION: Status post ERCP with pancreatic drain placement. Mild peripancreatic inflammatory changes along the pancreaticoduodenal groove, possibly reflecting mild acute pancreatitis, although postprocedural changes are also possible.  Small volume pelvic ascites.  No free air. Electronically Signed   By: Charline Bills M.D.   On: 01/17/2021 21:19    EKG: None obtained in ED  Assessment/Plan Acute on chronic pancreatitis Hx of pancreatic divisum s/p ERCP w/ stent placement @ [facility]  01/16/21     - admit to inpt, med-surg     - NPO except ice chips     - anti-emetics, pain control, fluids     - sidebarred Gen Surgery; no surgical needs at this time; appreciate assistance     - if not  improved by AM, will need to speak with [facility]: Dr. Vonda Antigua  GAD     - resume home regimen  DVT prophylaxis: lovenox  Code Status: FULL  Family Communication: None at bedside  Consults called: Sidebarred gen surgery   Status is: Inpatient  Remains inpatient appropriate because: severity of illness   Teddy Spike DO [facility]  If 7PM-7AM, please contact night-coverage [email or web address]  01/18/2021, 8:35 AM

## 2021-01-19 DIAGNOSIS — K9189 Other postprocedural complications and disorders of digestive system: Secondary | ICD-10-CM | POA: Diagnosis not present

## 2021-01-19 DIAGNOSIS — K859 Acute pancreatitis without necrosis or infection, unspecified: Secondary | ICD-10-CM | POA: Diagnosis not present

## 2021-01-19 LAB — COMPREHENSIVE METABOLIC PANEL
ALT: 14 U/L (ref 0–44)
AST: 13 U/L — ABNORMAL LOW (ref 15–41)
Albumin: 3.5 g/dL (ref 3.5–5.0)
Alkaline Phosphatase: 50 U/L (ref 38–126)
Anion gap: 5 (ref 5–15)
BUN: 8 mg/dL (ref 6–20)
CO2: 26 mmol/L (ref 22–32)
Calcium: 8.6 mg/dL — ABNORMAL LOW (ref 8.9–10.3)
Chloride: 102 mmol/L (ref 98–111)
Creatinine, Ser: 0.68 mg/dL (ref 0.44–1.00)
GFR, Estimated: >60 mL/min (ref >60)
Glucose, Bld: 72 mg/dL (ref 70–99)
Potassium: 3.8 mmol/L (ref 3.5–5.1)
Sodium: 133 mmol/L — ABNORMAL LOW (ref 135–145)
Total Bilirubin: 0.7 mg/dL (ref 0.3–1.2)
Total Protein: 6.7 g/dL (ref 6.5–8.1)

## 2021-01-19 LAB — CBC
HCT: 34.6 % — ABNORMAL LOW (ref 36.0–46.0)
Hemoglobin: 11.1 g/dL — ABNORMAL LOW (ref 12.0–15.0)
MCH: 29.2 pg (ref 26.0–34.0)
MCHC: 32.1 g/dL (ref 30.0–36.0)
MCV: 91.1 fL (ref 80.0–100.0)
Platelets: 214 10*3/uL (ref 150–400)
RBC: 3.8 MIL/uL — ABNORMAL LOW (ref 3.87–5.11)
RDW: 12.1 % (ref 11.5–15.5)
WBC: 7.6 10*3/uL (ref 4.0–10.5)
nRBC: 0 % (ref 0.0–0.2)

## 2021-01-19 MED ORDER — ESCITALOPRAM OXALATE 10 MG PO TABS
10.0000 mg | ORAL_TABLET | Freq: Every day | ORAL | Status: DC
  Administered 2021-01-19 – 2021-01-20 (×2): 10 mg via ORAL
  Filled 2021-01-19 (×2): qty 1

## 2021-01-19 NOTE — Progress Notes (Signed)
PROGRESS NOTE  Shannon Holloway YIR:[id number] DOB: Jan 23, 1996 DOA: 01/17/2021 PCP: Leilani Able, MD   LOS: 1 day   Brief Narrative / Interim history: 25 year old female with history of chronic pancreatitis, pancreatic divisum comes into the hospital with epigastric abdominal pain, nausea and vomiting.  She underwent an ERCP at [facility] 2 days prior to admission, and a stent was placed.  She was discharged without incident, and then later on when she was at home she started having abdominal pain, nausea vomiting and came to the ED.  She was found to have recurrent mild pancreatitis and was admitted to the hospital  Subjective / 24h Interval events: She is doing a little bit better this morning, pain is better as well as nausea and vomiting.  Wants to try to eat something  Assessment & Plan: Principal Problem Acute on chronic pancreatitis, history of pancreatic divisum status post ERCP on 10/28 at [facility] -continue conservative management, IV fluids, pain control as well as antiemetics -Allow clear liquid diet today and see how she does  Active Problems GAD-continue home regimen   Scheduled Meds:  docusate sodium  100 mg Oral BID   influenza vac split quadrivalent PF  0.5 mL Intramuscular Tomorrow-1000   Continuous Infusions:  lactated ringers 150 mL/hr at 01/19/21 0136   PRN Meds:.acetaminophen **OR** acetaminophen, HYDROmorphone (DILAUDID) injection, oxyCODONE, prochlorperazine, promethazine  Diet Orders (From admission, onward)     Start     Ordered   01/19/21 0802  Diet clear liquid Room service appropriate? Yes; Fluid consistency: Thin  Diet effective now       Question Answer Comment  Room service appropriate? Yes   Fluid consistency: Thin      01/19/21 0801            DVT prophylaxis: SCDs Start: 01/18/21 5009     Code Status: Full Code  Family Communication: no family at bedside   Status is: Inpatient  Remains inpatient appropriate because: IV fluids, IV  dilaudid   Level of care: Med-Surg  Consultants:  None   Procedures:  none  Microbiology  none  Antimicrobials: none    Objective: Vitals:   01/18/21 1753 01/18/21 2056 01/19/21 0145 01/19/21 0520  BP: 118/65 136/73 127/78 122/75  Pulse: (!) 57 65 (!) 52 (!) 52  Resp: 16 18 17 17   Temp: 98.1 F (36.7 C) 98.3 F (36.8 C) 99.1 F (37.3 C) 98 F (36.7 C)  TempSrc: Oral Oral Oral   SpO2: 100% 99% 99% 98%  Weight:      Height:        Intake/Output Summary (Last 24 hours) at 01/19/2021 0934 Last data filed at 01/19/2021 0400 Gross per 24 hour  Intake 2380 ml  Output --  Net 2380 ml   Filed Weights   01/17/21 1942  Weight: 90.7 kg    Examination:  Constitutional: NAD Eyes: no scleral icterus ENMT: Mucous membranes are moist.  Neck: normal, supple Respiratory: clear to auscultation bilaterally, no wheezing, no crackles.  Cardiovascular: Regular rate and rhythm, no murmurs / rubs / gallops.  Abdomen: Nondistended Musculoskeletal: no clubbing / cyanosis.  Skin: no rashes Neurologic: CN 2-12 grossly intact. Strength 5/5 in all 4.   Data Reviewed: I have independently reviewed following labs and imaging studies   CBC: Recent Labs  Lab 01/17/21 2038 01/18/21 1230 01/19/21 0437  WBC 12.5* 8.5 7.6  NEUTROABS 8.3* 4.4  --   HGB 12.3 10.8* 11.1*  HCT 37.6 34.0* 34.6*  MCV 90.0 91.9 91.1  PLT 261 209 214   Basic Metabolic Panel: Recent Labs  Lab 01/17/21 2038 01/18/21 1230 01/19/21 0437  NA 134* 136 133*  K 3.7 4.0 3.8  CL 102 105 102  CO2 23 28 26   GLUCOSE 94 79 72  BUN 10 9 8   CREATININE 0.80 0.78 0.68  CALCIUM 8.9 8.5* 8.6*   Liver Function Tests: Recent Labs  Lab 01/17/21 2038 01/18/21 1230 01/19/21 0437  AST 20 13* 13*  ALT 18 12 14   ALKPHOS 63 53 50  BILITOT 0.5 0.5 0.7  PROT 7.9 6.5 6.7  ALBUMIN 4.2 3.4* 3.5   Coagulation Profile: No results for input(s): INR, PROTIME in the last 168 hours. HbA1C: No results for input(s):  HGBA1C in the last 72 hours. CBG: No results for input(s): GLUCAP in the last 168 hours.  Recent Results (from the past 240 hour(s))  SARS CORONAVIRUS 2 (TAT 6-24 HRS) Nasopharyngeal Nasopharyngeal Swab     Status: None   Collection Time: 01/12/21  8:33 PM   Specimen: Nasopharyngeal Swab  Result Value Ref Range Status   SARS Coronavirus 2 NEGATIVE NEGATIVE Final    Comment: (NOTE) SARS-CoV-2 target nucleic acids are NOT DETECTED.  The SARS-CoV-2 RNA is generally detectable in upper and lower respiratory specimens during the acute phase of infection. Negative results do not preclude SARS-CoV-2 infection, do not rule out co-infections with other pathogens, and should not be used as the sole basis for treatment or other patient management decisions. Negative results must be combined with clinical observations, patient history, and epidemiological information. The expected result is Negative.  Fact Sheet for Patients: 01/21/21  Fact Sheet for Healthcare Providers:  This test is not yet approved or cleared by the 01/14/21 FDA and  has been authorized for detection and/or diagnosis of SARS-CoV-2 by FDA under an Emergency Use Authorization (EUA). This EUA will remain  in effect (meaning this test can be used) for the duration of the COVID-19 declaration under Se ction 564(b)(1) of the Act, 21 U.S.C. section 360bbb-3(b)(1), unless the authorization is terminated or revoked sooner.  Performed at [facility], [address] [place], [facility] [address]   Resp Panel by RT-PCR (Flu A&B, Covid) Nasopharyngeal Swab     Status: None   Collection Time: 01/17/21 11:17 PM   Specimen: Nasopharyngeal Swab; Nasopharyngeal(NP) swabs in vial transport medium  Result Value Ref Range Status   SARS Coronavirus 2 by RT PCR NEGATIVE NEGATIVE Final    Comment: (NOTE) SARS-CoV-2 target nucleic acids are NOT  DETECTED.  The SARS-CoV-2 RNA is generally detectable in upper respiratory specimens during the acute phase of infection. The lowest concentration of SARS-CoV-2 viral copies this assay can detect is 138 copies/mL. A negative result does not preclude SARS-Cov-2 infection and should not be used as the sole basis for treatment or other patient management decisions. A negative result may occur with  improper specimen collection/handling, submission of specimen other than nasopharyngeal swab, presence of viral mutation(s) within the areas targeted by this assay, and inadequate number of viral copies(<138 copies/mL). A negative result must be combined with clinical observations, patient history, and epidemiological information. The expected result is Negative.  Fact Sheet for Patients:  [place]  Fact Sheet for Healthcare Providers:  28366  This test is no t yet approved or cleared by the 01/19/21 FDA and  has been authorized for detection and/or diagnosis of SARS-CoV-2 by FDA under an Emergency Use Authorization (EUA). This EUA will remain  in effect (meaning this test can be used) for the duration of the COVID-19 declaration under Section 564(b)(1) of the Act, 21 U.S.C.section 360bbb-3(b)(1), unless the authorization is terminated  or revoked sooner.       Influenza A by PCR NEGATIVE NEGATIVE Final   Influenza B by PCR NEGATIVE NEGATIVE Final    Comment: (NOTE) The Xpert Xpress SARS-CoV-2/FLU/RSV plus assay is intended as an aid in the diagnosis of influenza from Nasopharyngeal swab specimens and should not be used as a sole basis for treatment. Nasal washings and aspirates are unacceptable for Xpert Xpress SARS-CoV-2/FLU/RSV testing.  Fact Sheet for Patients: BloggerCourse.com  Fact Sheet for Healthcare Providers: SeriousBroker.it  This test is not yet  approved or cleared by the [place] FDA and has been authorized for detection and/or diagnosis of SARS-CoV-2 by FDA under an Emergency Use Authorization (EUA). This EUA will remain in effect (meaning this test can be used) for the duration of the COVID-19 declaration under Section 564(b)(1) of the Act, 21 U.S.C. section 360bbb-3(b)(1), unless the authorization is terminated or revoked.  Performed at [facility], [address], [place], [place] [address]      Radiology Studies: No results found.  Pamella Pert, MD, PhD [facility]  Between 7 am - 7 pm I am available, please contact me via Amion (for emergencies) or Securechat (non urgent messages)  Between 7 pm - 7 am I am not available, please contact night coverage MD/APP via Amion

## 2021-01-20 DIAGNOSIS — K859 Acute pancreatitis without necrosis or infection, unspecified: Secondary | ICD-10-CM | POA: Diagnosis not present

## 2021-01-20 MED ORDER — PROMETHAZINE HCL 12.5 MG PO TABS: 12.5000 mg | ORAL_TABLET | Freq: Four times a day (QID) | ORAL | 0 refills | Status: DC | PRN

## 2021-01-20 MED ORDER — OXYCODONE HCL 5 MG PO TABS
5.0000 mg | ORAL_TABLET | ORAL | 0 refills | Status: DC | PRN
Start: 2021-01-20 — End: 2021-08-31

## 2021-01-20 NOTE — Discharge Summary (Signed)
Physician Discharge Summary  Shannon Holloway NPY:[id number] DOB: July 07, 1995 DOA: 01/17/2021  PCP: Leilani Able, MD  Admit date: 01/17/2021 Discharge date: 01/20/2021  Admitted From: home Disposition:  home  Recommendations for Outpatient Follow-up:  Follow up with PCP in 1-2 weeks Follow up with [facility] as scheduled  Home Health: none Equipment/Devices: none  Discharge Condition: stable CODE STATUS: Full code Diet recommendation: regular  HPI: Per admitting MD, Shannon Holloway is a 25 y.o. female with medical history significant of chronic pancreatitis, pancreatic divisum. Presenting with abdominal pain, N/V. Patient has an ERCP at [facility] 2 days ago. A stent was placed. She was discharged without incident. Upon arriving at home, she began having pain that was not controlled with APAP. She had N/V. She spoke to her GI doc who recommended that she come to the ED for evaluation. She denies any other aggravating or alleviating factors.   Hospital Course / Discharge diagnoses: Principal Problem Acute on chronic pancreatitis, history of pancreatic divisum status post ERCP on 10/28 at [facility] -patient was admitted to the hospital initially n.p.o., IV fluids, pain control and antiemetics.  With conservative management she has improved, her diet was slowly advanced and currently is able to tolerate a regular diet, abdominal pain has improved, no longer has vomiting and will be discharged home in stable condition.  She was advised to follow-up with primary gastroenterologist at [facility].   Active Problems GAD-continue home regimen  Sepsis ruled out   Discharge Instructions   Allergies as of 01/20/2021   No Known Allergies      Medication List     TAKE these medications    escitalopram 10 MG tablet Commonly known as: LEXAPRO Take 1 tablet (10 mg total) by mouth daily.   oxyCODONE 5 MG immediate release tablet Commonly known as: Roxicodone Take 1 tablet (5 mg total) by mouth every  4 (four) hours as needed for severe pain.   promethazine 12.5 MG tablet Commonly known as: PHENERGAN Take 1 tablet (12.5 mg total) by mouth every 6 (six) hours as needed for nausea.         Consultations: None   Procedures/Studies:  CT ABDOMEN PELVIS W CONTRAST  Result Date: 01/17/2021 CLINICAL DATA:  Abdominal pain, nausea/vomiting, history of pancreatitis. ERCP yesterday. EXAM: CT ABDOMEN AND PELVIS WITH CONTRAST TECHNIQUE: Multidetector CT imaging of the abdomen and pelvis was performed using the standard protocol following bolus administration of intravenous contrast. CONTRAST:  OMNIPAQUE IOHEXOL 300 MG/ML  SOLN COMPARISON:  MRI abdomen dated 08/19/2020. CT abdomen dated 12/04/2019. FINDINGS: Lower chest: Lung bases are clear. Hepatobiliary: Liver is within normal limits. Status post cholecystectomy. No intrahepatic or extrahepatic ductal dilatation. Pancreas: Stable prominence of the pancreatic head/uncinate process with atrophy of the pancreatic body/tail. Indwelling pancreatic drain with pigtail in the proximal duodenum. No pancreatic ductal dilatation. Stable 13 mm peripancreatic cyst anteriorly along the pancreatic head (series 2/image 31), favoring a pseudocyst. Mild inflammatory changes along the pancreaticoduodenal groove (series 2/image 37), possibly reflecting mild acute pancreatitis, although this may also be postprocedural. Spleen: Within normal limits. Adrenals/Urinary Tract: Adrenal glands are within normal limits. Kidneys are within normal limits.  No hydronephrosis. Bladder is within normal limits. Stomach/Bowel: Stomach is within normal limits. No evidence of bowel obstruction. Appendix is not well visualized. Vascular/Lymphatic: No evidence of abdominal aortic aneurysm. No suspicious abdominopelvic lymphadenopathy. Reproductive: Uterus is within normal limits. Bilateral ovaries are within normal limits. Other: Small volume pelvic ascites. No free air. Musculoskeletal:  Visualized osseous structures are  within normal limits. IMPRESSION: Status post ERCP with pancreatic drain placement. Mild peripancreatic inflammatory changes along the pancreaticoduodenal groove, possibly reflecting mild acute pancreatitis, although postprocedural changes are also possible. Small volume pelvic ascites.  No free air. Electronically Signed   By: Charline Bills M.D.   On: 01/17/2021 21:19     Subjective: - no chest pain, shortness of breath, no abdominal pain, nausea or vomiting.   Discharge Exam: BP 123/76   Pulse (!) 56   Temp 98.6 F (37 C) (Oral)   Resp 16   Ht 5\' 7"  (1.702 m)   Wt 90.7 kg   LMP 12/26/2020   SpO2 99%   BMI 31.32 kg/m   General: Pt is alert, awake, not in acute distress   The results of significant diagnostics from this hospitalization (including imaging, microbiology, ancillary and laboratory) are listed below for reference.     Microbiology: Recent Results (from the past 240 hour(s))  SARS CORONAVIRUS 2 (TAT 6-24 HRS) Nasopharyngeal Nasopharyngeal Swab     Status: None   Collection Time: 01/12/21  8:33 PM   Specimen: Nasopharyngeal Swab  Result Value Ref Range Status   SARS Coronavirus 2 NEGATIVE NEGATIVE Final    Comment: (NOTE) SARS-CoV-2 target nucleic acids are NOT DETECTED.  The SARS-CoV-2 RNA is generally detectable in upper and lower respiratory specimens during the acute phase of infection. Negative results do not preclude SARS-CoV-2 infection, do not rule out co-infections with other pathogens, and should not be used as the sole basis for treatment or other patient management decisions. Negative results must be combined with clinical observations, patient history, and epidemiological information. The expected result is Negative.  Fact Sheet for Patients: 01/14/21  Fact Sheet for Healthcare Providers: HairSlick.no  This test is not yet approved or cleared by  the quierodirigir.com FDA and  has been authorized for detection and/or diagnosis of SARS-CoV-2 by FDA under an Emergency Use Authorization (EUA). This EUA will remain  in effect (meaning this test can be used) for the duration of the COVID-19 declaration under Se ction 564(b)(1) of the Act, 21 U.S.C. section 360bbb-3(b)(1), unless the authorization is terminated or revoked sooner.  Performed at [facility], [address], [place], [place]   Resp Panel by RT-PCR (Flu A&B, Covid) Nasopharyngeal Swab     Status: None   Collection Time: 01/17/21 11:17 PM   Specimen: Nasopharyngeal Swab; Nasopharyngeal(NP) swabs in vial transport medium  Result Value Ref Range Status   SARS Coronavirus 2 by RT PCR NEGATIVE NEGATIVE Final    Comment: (NOTE) SARS-CoV-2 target nucleic acids are NOT DETECTED.  The SARS-CoV-2 RNA is generally detectable in upper respiratory specimens during the acute phase of infection. The lowest concentration of SARS-CoV-2 viral copies this assay can detect is 138 copies/mL. A negative result does not preclude SARS-Cov-2 infection and should not be used as the sole basis for treatment or other patient management decisions. A negative result may occur with  improper specimen collection/handling, submission of specimen other than nasopharyngeal swab, presence of viral mutation(s) within the areas targeted by this assay, and inadequate number of viral copies(<138 copies/mL). A negative result must be combined with clinical observations, patient history, and epidemiological information. The expected result is Negative.  Fact Sheet for Patients:  01/19/21  Fact Sheet for Healthcare Providers:  BloggerCourse.com  This test is no t yet approved or cleared by the SeriousBroker.it FDA and  has been authorized for detection and/or diagnosis of SARS-CoV-2 by  FDA under an Emergency Use Authorization (EUA). This  EUA will remain  in effect (meaning this test can be used) for the duration of the COVID-19 declaration under Section 564(b)(1) of the Act, 21 U.S.C.section 360bbb-3(b)(1), unless the authorization is terminated  or revoked sooner.       Influenza A by PCR NEGATIVE NEGATIVE Final   Influenza B by PCR NEGATIVE NEGATIVE Final    Comment: (NOTE) The Xpert Xpress SARS-CoV-2/FLU/RSV plus assay is intended as an aid in the diagnosis of influenza from Nasopharyngeal swab specimens and should not be used as a sole basis for treatment. Nasal washings and aspirates are unacceptable for Xpert Xpress SARS-CoV-2/FLU/RSV testing.  Fact Sheet for Patients: BloggerCourse.com  Fact Sheet for Healthcare Providers: SeriousBroker.it  This test is not yet approved or cleared by the [place] FDA and has been authorized for detection and/or diagnosis of SARS-CoV-2 by FDA under an Emergency Use Authorization (EUA). This EUA will remain in effect (meaning this test can be used) for the duration of the COVID-19 declaration under Section 564(b)(1) of the Act, 21 U.S.C. section 360bbb-3(b)(1), unless the authorization is terminated or revoked.  Performed at [facility], [address] [place], [place] [address]      Labs: Basic Metabolic Panel: Recent Labs  Lab 01/17/21 2038 01/18/21 1230 01/19/21 0437  NA 134* 136 133*  K 3.7 4.0 3.8  CL 102 105 102  CO2 23 28 26   GLUCOSE 94 79 72  BUN 10 9 8   CREATININE 0.80 0.78 0.68  CALCIUM 8.9 8.5* 8.6*   Liver Function Tests: Recent Labs  Lab 01/17/21 2038 01/18/21 1230 01/19/21 0437  AST 20 13* 13*  ALT 18 12 14   ALKPHOS 63 53 50  BILITOT 0.5 0.5 0.7  PROT 7.9 6.5 6.7  ALBUMIN 4.2 3.4* 3.5   CBC: Recent Labs  Lab 01/17/21 2038 01/18/21 1230 01/19/21 0437  WBC 12.5* 8.5 7.6  NEUTROABS 8.3* 4.4  --   HGB 12.3 10.8* 11.1*  HCT 37.6 34.0* 34.6*  MCV 90.0 91.9 91.1   PLT 261 209 214   CBG: No results for input(s): GLUCAP in the last 168 hours. Hgb A1c No results for input(s): HGBA1C in the last 72 hours. Lipid Profile No results for input(s): CHOL, HDL, LDLCALC, TRIG, CHOLHDL, LDLDIRECT in the last 72 hours. Thyroid function studies No results for input(s): TSH, T4TOTAL, T3FREE, THYROIDAB in the last 72 hours.  Invalid input(s): FREET3 Urinalysis    Component Value Date/Time   COLORURINE YELLOW 11/26/2020 1033   APPEARANCEUR CLEAR 11/26/2020 1033   LABSPEC 1.016 11/26/2020 1033   PHURINE 6.0 11/26/2020 1033   GLUCOSEU NEGATIVE 11/26/2020 1033   HGBUR LARGE (A) 11/26/2020 1033   BILIRUBINUR NEGATIVE 11/26/2020 1033   BILIRUBINUR negative 11/22/2019 2003   KETONESUR NEGATIVE 11/26/2020 1033   PROTEINUR 30 (A) 11/26/2020 1033   UROBILINOGEN 1.0 11/22/2019 2003   UROBILINOGEN 0.2 04/03/2019 1126   NITRITE NEGATIVE 11/26/2020 1033   LEUKOCYTESUR TRACE (A) 11/26/2020 1033    FURTHER DISCHARGE INSTRUCTIONS:   Get Medicines reviewed and adjusted: Please take all your medications with you for your next visit with your Primary MD   Laboratory/radiological data: Please request your Primary MD to go over all hospital tests and procedure/radiological results at the follow up, please ask your Primary MD to get all Hospital records sent to his/her office.   In some cases, they will be blood work, cultures and biopsy results pending at the time of  your discharge. Please request that your primary care M.D. goes through all the records of your hospital data and follows up on these results.   Also Note the following: If you experience worsening of your admission symptoms, develop shortness of breath, life threatening emergency, suicidal or homicidal thoughts you must seek medical attention immediately by calling 911 or calling your MD immediately  if symptoms less severe.   You must read complete instructions/literature along with all the possible  adverse reactions/side effects for all the Medicines you take and that have been prescribed to you. Take any new Medicines after you have completely understood and accpet all the possible adverse reactions/side effects.    Do not drive when taking Pain medications or sleeping medications (Benzodaizepines)   Do not take more than prescribed Pain, Sleep and Anxiety Medications. It is not advisable to combine anxiety,sleep and pain medications without talking with your primary care practitioner   Special Instructions: If you have smoked or chewed Tobacco  in the last 2 yrs please stop smoking, stop any regular Alcohol  and or any Recreational drug use.   Wear Seat belts while driving.   Please note: You were cared for by a hospitalist during your hospital stay. Once you are discharged, your primary care physician will handle any further medical issues. Please note that NO REFILLS for any discharge medications will be authorized once you are discharged, as it is imperative that you return to your primary care physician (or establish a relationship with a primary care physician if you do not have one) for your post hospital discharge needs so that they can reassess your need for medications and monitor your lab values.  Time coordinating discharge: 20 minutes  SIGNED:  Pamella Pert, MD, PhD 01/20/2021, 12:38 PM

## 2021-03-24 ENCOUNTER — Telehealth: Payer: No Typology Code available for payment source | Admitting: Physician Assistant

## 2021-03-24 DIAGNOSIS — R112 Nausea with vomiting, unspecified: Secondary | ICD-10-CM

## 2021-03-24 MED ORDER — ONDANSETRON 4 MG PO TBDP: 4.0000 mg | ORAL_TABLET | Freq: Three times a day (TID) | ORAL | 0 refills | Status: DC | PRN

## 2021-03-24 NOTE — Patient Instructions (Signed)
°  Lakeena Fisher-Parrett, thank you for joining Mar Daring, PA-C for today's virtual visit.  While this provider is not your primary care provider (PCP), if your PCP is located in our provider database this encounter information will be shared with them immediately following your visit.  Consent: (Patient) Shannon Holloway provided verbal consent for this virtual visit at the beginning of the encounter.  Current Medications:  Current Outpatient Medications:    ondansetron (ZOFRAN-ODT) 4 MG disintegrating tablet, Take 1 tablet (4 mg total) by mouth every 8 (eight) hours as needed for nausea or vomiting., Disp: 20 tablet, Rfl: 0   escitalopram (LEXAPRO) 10 MG tablet, Take 1 tablet (10 mg total) by mouth daily., Disp: 30 tablet, Rfl: 0   oxyCODONE (ROXICODONE) 5 MG immediate release tablet, Take 1 tablet (5 mg total) by mouth every 4 (four) hours as needed for severe pain., Disp: 10 tablet, Rfl: 0   promethazine (PHENERGAN) 12.5 MG tablet, Take 1 tablet (12.5 mg total) by mouth every 6 (six) hours as needed for nausea., Disp: 10 tablet, Rfl: 0   Medications ordered in this encounter:  Meds ordered this encounter  Medications   ondansetron (ZOFRAN-ODT) 4 MG disintegrating tablet    Sig: Take 1 tablet (4 mg total) by mouth every 8 (eight) hours as needed for nausea or vomiting.    Dispense:  20 tablet    Refill:  0    Order Specific Question:   Supervising Provider    Answer:   Sabra Heck, BRIAN [[id number]]     *If you need refills on other medications prior to your next appointment, please contact your pharmacy*  Follow-Up: Call back or seek an in-person evaluation if the symptoms worsen or if the condition fails to improve as anticipated   If you have been instructed to have an in-person evaluation today at a local Urgent Care facility, please use the link below. It will take you to a list of all of our available [facility], including address, phone number and hours of  operation. Please do not delay care.  [facility]  If you or a family member do not have a primary care provider, use the link below to schedule a visit and establish care. When you choose a [facility] primary care physician or advanced practice provider, you gain a long-term partner in health. Find a Primary Care Provider  Learn more about [facility] [facility]'s in-office and virtual care options: [facility] [facility]

## 2021-03-24 NOTE — Progress Notes (Signed)
Virtual Visit Consent   Shannon Holloway, you are scheduled for a virtual visit with a [facility] provider today.     Just as with appointments in the office, your consent must be obtained to participate.  Your consent will be active for this visit and any virtual visit you may have with one of our providers in the next 365 days.     If you have a MyChart account, a copy of this consent can be sent to you electronically.  All virtual visits are billed to your insurance company just like a traditional visit in the office.    As this is a virtual visit, video technology does not allow for your provider to perform a traditional examination.  This may limit your provider's ability to fully assess your condition.  If your provider identifies any concerns that need to be evaluated in person or the need to arrange testing (such as labs, EKG, etc.), we will make arrangements to do so.     Although advances in technology are sophisticated, we cannot ensure that it will always work on either your end or our end.  If the connection with a video visit is poor, the visit may have to be switched to a telephone visit.  With either a video or telephone visit, we are not always able to ensure that we have a secure connection.     I need to obtain your verbal consent now.   Are you willing to proceed with your visit today?    Shannon Holloway has provided verbal consent on 03/24/2021 for a virtual visit (video or telephone).   Mar Daring, PA-C   Date: 03/24/2021 4:10 PM   Virtual Visit via Video Note   I, Mar Daring, connected with  Shannon Holloway  ([id number], 1995-10-22) on 03/24/21 at  4:00 PM EST by a video-enabled telemedicine application and verified that I am speaking with the correct person using two identifiers.  Location: Patient: Virtual Visit Location Patient: Home Provider: Virtual Visit Location Provider: Home Office   I discussed the limitations of evaluation and  management by telemedicine and the availability of in person appointments. The patient expressed understanding and agreed to proceed.    History of Present Illness: Shannon Holloway is a 26 y.o. who identifies as a female who was assigned female at birth, and is being seen today for Covid 82. Tested positive for Covid 19 on 03/19/21. Reports she has seen improvement in her Covid respiratory symptoms, but last night developed nausea and vomiting. She is having some abdominal pain in the upper abdomen that moves to right and left side. 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 She reports this pain does feel different and she is not concerned it is a pancreatitis attack.    Problems:  Patient Active Problem List   Diagnosis Date Noted   Acute pancreatitis 01/18/2021   Acute pancreatitis after endoscopic retrograde cholangiopancreatography (ERCP) 01/17/2021   Pancreatitis 08/17/2020   Tobacco abuse 08/17/2020   History of panic attacks 05/19/2016   Major depressive disorder 05/19/2016    Allergies: No Known Allergies Medications:  Current Outpatient Medications:    ondansetron (ZOFRAN-ODT) 4 MG disintegrating tablet, Take 1 tablet (4 mg total) by mouth every 8 (eight) hours as needed for nausea or vomiting., Disp: 20 tablet, Rfl: 0   escitalopram (LEXAPRO) 10 MG tablet, Take 1 tablet (10 mg total) by mouth daily., Disp: 30 tablet, Rfl: 0  oxyCODONE (ROXICODONE) 5 MG immediate release tablet, Take 1 tablet (5 mg total) by mouth every 4 (four) hours as needed for severe pain., Disp: 10 tablet, Rfl: 0   promethazine (PHENERGAN) 12.5 MG tablet, Take 1 tablet (12.5 mg total) by mouth every 6 (six) hours as needed for nausea., Disp: 10 tablet, Rfl: 0  Observations/Objective: Patient is well-developed, well-nourished in no acute distress.  Resting comfortably at home.  Head is normocephalic, atraumatic.  No labored breathing.  Speech is clear and  coherent with logical content.  Patient is alert and oriented at baseline.    Assessment and Plan: 1. Nausea and vomiting, unspecified vomiting type - ondansetron (ZOFRAN-ODT) 4 MG disintegrating tablet; Take 1 tablet (4 mg total) by mouth every 8 (eight) hours as needed for nausea or vomiting.  Dispense: 20 tablet; Refill: 0  - Nausea and vomiting at day 5 of Covid 19 infection - Suspect viral shedding through GI tract - Advised to monitor of symptoms of pancreatitis - Discussed signs and symptoms of gastroparesis as well and to seek immediate care if these develop - Work note provided - Seek in person evaluation if symptoms fail to improve  Follow Up Instructions: I discussed the assessment and treatment plan with the patient. The patient was provided an opportunity to ask questions and all were answered. The patient agreed with the plan and demonstrated an understanding of the instructions.  A copy of instructions were sent to the patient via MyChart unless otherwise noted below.    The patient was advised to call back or seek an in-person evaluation if the symptoms worsen or if the condition fails to improve as anticipated.  Time:  I spent 12 minutes with the patient via telehealth technology discussing the above problems/concerns.    Mar Daring, PA-C

## 2021-08-31 ENCOUNTER — Ambulatory Visit (HOSPITAL_COMMUNITY)
Admission: EM | Admit: 2021-08-31 | Discharge: 2021-08-31 | Disposition: A | Discharge status: Home / Self Care | Payer: No Typology Code available for payment source | Attending: Physician Assistant | Admitting: Physician Assistant

## 2021-08-31 ENCOUNTER — Encounter (HOSPITAL_COMMUNITY): Payer: Self-pay | Admitting: Emergency Medicine

## 2021-08-31 ENCOUNTER — Other Ambulatory Visit: Payer: Self-pay

## 2021-08-31 ENCOUNTER — Emergency Department (HOSPITAL_BASED_OUTPATIENT_CLINIC_OR_DEPARTMENT_OTHER)
Admission: EM | Admit: 2021-08-31 | Discharge: 2021-08-31 | Disposition: A | Discharge status: Home / Self Care | Payer: No Typology Code available for payment source | Attending: Emergency Medicine | Admitting: Emergency Medicine

## 2021-08-31 DIAGNOSIS — R112 Nausea with vomiting, unspecified: Secondary | ICD-10-CM

## 2021-08-31 DIAGNOSIS — H5789 Other specified disorders of eye and adnexa: Secondary | ICD-10-CM | POA: Diagnosis not present

## 2021-08-31 DIAGNOSIS — H538 Other visual disturbances: Secondary | ICD-10-CM | POA: Insufficient documentation

## 2021-08-31 DIAGNOSIS — G43009 Migraine without aura, not intractable, without status migrainosus: Secondary | ICD-10-CM

## 2021-08-31 LAB — POCT URINALYSIS DIPSTICK, ED / UC
Bilirubin Urine: NEGATIVE
Glucose, UA: NEGATIVE mg/dL
Hgb urine dipstick: NEGATIVE
Ketones, ur: NEGATIVE mg/dL
Leukocytes,Ua: NEGATIVE
Nitrite: NEGATIVE
Protein, ur: NEGATIVE mg/dL
Specific Gravity, Urine: 1.02 (ref 1.005–1.030)
Urobilinogen, UA: 0.2 mg/dL (ref 0.0–1.0)
pH: 5.5 (ref 5.0–8.0)

## 2021-08-31 LAB — POC URINE PREG, ED: Preg Test, Ur: NEGATIVE

## 2021-08-31 MED ORDER — AMOXICILLIN-POT CLAVULANATE 875-125 MG PO TABS: 1.0000 | ORAL_TABLET | Freq: Two times a day (BID) | ORAL | 0 refills | Status: DC

## 2021-08-31 MED ORDER — FLUORESCEIN SODIUM 1 MG OP STRP
1.0000 | ORAL_STRIP | Freq: Once | OPHTHALMIC | Status: AC
  Administered 2021-08-31: 1 via OPHTHALMIC
  Filled 2021-08-31: qty 1

## 2021-08-31 MED ORDER — TETRACAINE HCL 0.5 % OP SOLN
2.0000 [drp] | Freq: Once | OPHTHALMIC | Status: AC
  Administered 2021-08-31: 2 [drp] via OPHTHALMIC
  Filled 2021-08-31: qty 4

## 2021-08-31 MED ORDER — ONDANSETRON 4 MG PO TBDP: 4.0000 mg | ORAL_TABLET | Freq: Three times a day (TID) | ORAL | 0 refills | Status: DC | PRN

## 2021-08-31 MED ORDER — KETOROLAC TROMETHAMINE 30 MG/ML IJ SOLN
30.0000 mg | Freq: Once | INTRAMUSCULAR | Status: AC
  Administered 2021-08-31: 30 mg via INTRAMUSCULAR

## 2021-08-31 MED ORDER — KETOROLAC TROMETHAMINE 30 MG/ML IJ SOLN
INTRAMUSCULAR | Status: AC
  Filled 2021-08-31: qty 1

## 2021-08-31 MED ORDER — BACLOFEN 10 MG PO TABS: 10.0000 mg | ORAL_TABLET | Freq: Every evening | ORAL | 0 refills | Status: DC | PRN

## 2021-08-31 NOTE — ED Notes (Signed)
Pt. R eye lid is swollen.  She was at [facility] today with and given abx. And eye dropps.  Pt. Here now due to pressure behind the R eye with an episode of vomiting.

## 2021-08-31 NOTE — ED Provider Notes (Signed)
[facility] EMERGENCY DEPARTMENT Provider Note   CSN: [id number] Arrival date & time: 08/31/21  1939     History  Chief Complaint  Patient presents with   Blurred Vision    Shannon Holloway is a 26 y.o. female.  26 yo F with a chief complaints of right upper eyelid swelling.  This was going on for about a day and has actually improved quite a bit.  She went to urgent care and was having a terrible right-sided headache with that.  She was started on antibiotics and given an injection for her headache.  Over the past few hours she has realized that the vision in her right eye is a bit blurry and had not had trouble with the vision before.  She also feels like she is having some squeezing in that eye.  Denies any issues with that eye previously.  Denies rash to her face.  Denies trauma.        Home Medications Prior to Admission medications   Medication Sig Start Date End Date Taking? Authorizing Provider  amoxicillin-clavulanate (AUGMENTIN) 875-125 MG tablet Take 1 tablet by mouth every 12 (twelve) hours. 08/31/21   Raspet, Noberto Retort, PA-C  baclofen (LIORESAL) 10 MG tablet Take 1 tablet (10 mg total) by mouth at bedtime as needed for muscle spasms. 08/31/21   Raspet, Denny Peon K, PA-C  ondansetron (ZOFRAN-ODT) 4 MG disintegrating tablet Take 1 tablet (4 mg total) by mouth every 8 (eight) hours as needed for nausea or vomiting. 08/31/21   Raspet, Denny Peon K, PA-C  buPROPion (WELLBUTRIN XL) 150 MG 24 hr tablet Take 1 tablet (150 mg total) by mouth daily. Patient not taking: Reported on 12/21/2018 02/24/17 12/21/18  Thresa Ross, MD  VYVANSE 20 MG capsule Take 1 capsule (20 mg total) by mouth every morning. Patient not taking: Reported on 11/24/2018 11/23/16 12/21/18  Thresa Ross, MD      Allergies    Patient has no known allergies.    Review of Systems   Review of Systems  Physical Exam Updated Vital Signs BP (!) 147/102 (BP Location: Right Arm)   Pulse 65   Temp 98.5 F (36.9  C) (Oral)   Resp 17   Ht 5\' 7"  (1.702 m)   Wt 85.7 kg   LMP 08/04/2021   SpO2 100%   BMI 29.60 kg/m  Physical Exam Vitals and nursing note reviewed.  Constitutional:      General: She is not in acute distress.    Appearance: She is well-developed. She is not diaphoretic.  HENT:     Head: Normocephalic and atraumatic.  Eyes:     General: Lids are normal. Lids are everted, no foreign bodies appreciated.     Intraocular pressure: Right eye pressure is 17 mmHg. Measurements were taken using a handheld tonometer.    Extraocular Movements: Extraocular movements intact.     Right eye: No nystagmus.     Conjunctiva/sclera: Conjunctivae normal.     Pupils: Pupils are equal, round, and reactive to light.  Cardiovascular:     Rate and Rhythm: Normal rate and regular rhythm.     Heart sounds: No murmur heard.    No friction rub. No gallop.  Pulmonary:     Effort: Pulmonary effort is normal.     Breath sounds: No wheezing or rales.  Abdominal:     General: There is no distension.     Palpations: Abdomen is soft.     Tenderness: There is no abdominal tenderness.  Musculoskeletal:        General: No tenderness.     Cervical back: Normal range of motion and neck supple.  Skin:    General: Skin is warm and dry.  Neurological:     Mental Status: She is alert and oriented to person, place, and time.  Psychiatric:        Behavior: Behavior normal.     ED Results / Procedures / Treatments   Labs (all labs ordered are listed, but only abnormal results are displayed) Labs Reviewed - No data to display  EKG None  Radiology No results found.  Procedures Procedures    Medications Ordered in ED Medications  fluorescein ophthalmic strip 1 strip (1 strip Right Eye Given 08/31/21 2115)  tetracaine (PONTOCAINE) 0.5 % ophthalmic solution 2 drop (2 drops Right Eye Given 08/31/21 2114)    ED Course/ Medical Decision Making/ A&P                           Medical Decision  Making Risk Prescription drug management.   26 yo F with a chief complaints of change in vision to her right eye and a sensation like it squeezing.  This occurred after she had had some right upper eyelid swelling.  She was seen at urgent care and was started on antibiotics and had had some improvement of the eyelid swelling.  I am having trouble connecting the 2 different symptoms.  It does not seem like she had any right eye issues at urgent care.  No obvious abnormality on initial eye exam.  She does have some signs of edema to the right eyelid but does appear to be significantly better compared to a picture that she has on her cell phone.  Seems to be monocular based on history and physical.  Will stain and check pressure of the right eye.  No obvious issue with the cornea on fluorescein staining.  Intraocular pressure was measured 17 on the right.  Pupils equal round and reactive to light.  We will have her call the ophthalmologist in the morning.  9:51 PM:  I have discussed the diagnosis/risks/treatment options with the patient.  Evaluation and diagnostic testing in the emergency department does not suggest an emergent condition requiring admission or immediate intervention beyond what has been performed at this time.  They will follow up with  PCP, ophtho. We also discussed returning to the ED immediately if new or worsening sx occur. We discussed the sx which are most concerning (e.g., sudden worsening pain, fever, inability to tolerate by mouth) that necessitate immediate return. Medications administered to the patient during their visit and any new prescriptions provided to the patient are listed below.  Medications given during this visit Medications  fluorescein ophthalmic strip 1 strip (1 strip Right Eye Given 08/31/21 2115)  tetracaine (PONTOCAINE) 0.5 % ophthalmic solution 2 drop (2 drops Right Eye Given 08/31/21 2114)     The patient appears reasonably screen and/or stabilized for  discharge and I doubt any other medical condition or other [facility] requiring further screening, evaluation, or treatment in the ED at this time prior to discharge.          Final Clinical Impression(s) / ED Diagnoses Final diagnoses:  Blurry vision, right eye    Rx / DC Orders ED Discharge Orders     None         Melene Plan, DO 08/31/21 2151

## 2021-08-31 NOTE — ED Provider Notes (Signed)
[facility]    CSN: [id number] Arrival date & time: 08/31/21  6387      History   Chief Complaint Chief Complaint  Patient presents with   Facial Swelling   Headache    HPI Shannon Holloway is a 26 y.o. female.   Patient presents today with a 2-day history of right eye swelling and associated headache.  Reports that several days ago she woke up with her eye very swollen.  She denies any visual disturbance but has had some photophobia.  Denies any recent illness or additional symptoms including cough, congestion, nausea, vomiting.  She reports headache pain is rated 7.5 on a 0-10 pain scale, described as throbbing, surrounding right eye.  She has tried Tylenol without improvement of symptoms.  Denies history of migraine or primary headache disorder.  She does have a family history of migraines in her mother.  Denies any recent head injury or medication changes.  Denies any dysarthria or focal weakness.  She does report that she had nausea and vomiting earlier today.  She has a history of chronic pancreatitis but current symptoms are not similar to previous episodes of this condition.  She is confident that she is not pregnant as she uses patch birth control.    Past Medical History:  Diagnosis Date   ADHD (attention deficit hyperactivity disorder)    Anxiety    Depression    Pancreatitis 2012    Patient Active Problem List   Diagnosis Date Noted   Acute pancreatitis 01/18/2021   Acute pancreatitis after endoscopic retrograde cholangiopancreatography (ERCP) 01/17/2021   Pancreatitis 08/17/2020   Tobacco abuse 08/17/2020   History of panic attacks 05/19/2016   Major depressive disorder 05/19/2016    Past Surgical History:  Procedure Laterality Date   CHOLECYSTECTOMY  2012   HERNIA REPAIR  2001    OB History   No obstetric history on file.      Home Medications    Prior to Admission medications   Medication Sig Start Date End Date Taking? Authorizing  Provider  amoxicillin-clavulanate (AUGMENTIN) 875-125 MG tablet Take 1 tablet by mouth every 12 (twelve) hours. 08/31/21  Yes Arriana Lohmann, Marygrace K, PA-C  baclofen (LIORESAL) 10 MG tablet Take 1 tablet (10 mg total) by mouth at bedtime as needed for muscle spasms. 08/31/21  Yes Davarius Ridener, Chanie K, PA-C  ondansetron (ZOFRAN-ODT) 4 MG disintegrating tablet Take 1 tablet (4 mg total) by mouth every 8 (eight) hours as needed for nausea or vomiting. 08/31/21   Joyclyn Plazola, Denny Peon K, PA-C  buPROPion (WELLBUTRIN XL) 150 MG 24 hr tablet Take 1 tablet (150 mg total) by mouth daily. Patient not taking: Reported on 12/21/2018 02/24/17 12/21/18  Thresa Ross, MD  VYVANSE 20 MG capsule Take 1 capsule (20 mg total) by mouth every morning. Patient not taking: Reported on 11/24/2018 11/23/16 12/21/18  Thresa Ross, MD    Family History Family History  Problem Relation Age of Onset   Anxiety disorder Mother    Anxiety disorder Maternal Grandmother     Social History Social History   Tobacco Use   Smoking status: Never   Smokeless tobacco: Never  Substance Use Topics   Alcohol use: Yes    Comment: social   Drug use: Yes    Types: Marijuana     Allergies   Patient has no known allergies.   Review of Systems Review of Systems  Constitutional:  Positive for activity change. Negative for appetite change, fatigue and fever.  HENT:  Negative  for congestion.   Eyes:  Positive for photophobia. Negative for visual disturbance.  Respiratory:  Negative for cough and shortness of breath.   Gastrointestinal:  Positive for diarrhea, nausea and vomiting. Negative for abdominal pain.  Neurological:  Positive for headaches. Negative for dizziness, speech difficulty, weakness and light-headedness.     Physical Exam Triage Vital Signs ED Triage Vitals  Enc Vitals Group     BP 08/31/21 0844 130/81     Pulse Rate 08/31/21 0844 96     Resp 08/31/21 0844 16     Temp 08/31/21 0844 98.8 F (37.1 C)     Temp Source 08/31/21 0844  Oral     SpO2 08/31/21 0844 99 %     Weight --      Height --      Head Circumference --      Peak Flow --      Pain Score 08/31/21 0843 7     Pain Loc --      Pain Edu? --      Excl. in GC? --    No data found.  Updated Vital Signs BP 130/81 (BP Location: Right Arm)   Pulse 96   Temp 98.8 F (37.1 C) (Oral)   Resp 16   LMP 08/04/2021   SpO2 99%   Visual Acuity Right Eye Distance: 20/30 Left Eye Distance: 20/15 Bilateral Distance: 20/13  Right Eye Near:   Left Eye Near:    Bilateral Near:     Physical Exam Vitals reviewed.  Constitutional:      General: She is awake. She is not in acute distress.    Appearance: Normal appearance. She is well-developed. She is not ill-appearing.     Comments: Very pleasant female appears stated age in no acute distress sitting comfortably in exam room  HENT:     Head: Normocephalic and atraumatic. No raccoon eyes, Battle's sign or contusion.     Right Ear: Tympanic membrane, ear canal and external ear normal. No hemotympanum.     Left Ear: Tympanic membrane, ear canal and external ear normal. No hemotympanum.     Nose: Nose normal.     Mouth/Throat:     Tongue: Tongue does not deviate from midline.     Pharynx: Uvula midline. No oropharyngeal exudate or posterior oropharyngeal erythema.  Eyes:     Extraocular Movements: Extraocular movements intact.     Conjunctiva/sclera: Conjunctivae normal.     Pupils: Pupils are equal, round, and reactive to light.  Cardiovascular:     Rate and Rhythm: Normal rate and regular rhythm.     Heart sounds: Normal heart sounds, S1 normal and S2 normal. No murmur heard. Pulmonary:     Effort: Pulmonary effort is normal.     Breath sounds: Normal breath sounds. No wheezing, rhonchi or rales.     Comments: Clear to auscultation bilaterally Musculoskeletal:     Comments: Strength 5/5 bilateral upper and lower extremities  Neurological:     General: No focal deficit present.     Mental Status: She  is alert and oriented to person, place, and time.     Cranial Nerves: Cranial nerves 2-12 are intact.     Motor: Motor function is intact.     Coordination: Coordination is intact. Romberg sign negative.     Gait: Gait is intact.     Comments: No focal neurological defect on exam.  Cranial nerves II through XII grossly intact.  Psychiatric:  Behavior: Behavior is cooperative.      UC Treatments / Results  Labs (all labs ordered are listed, but only abnormal results are displayed) Labs Reviewed  POCT URINALYSIS DIPSTICK, ED / UC  POC URINE PREG, ED    EKG   Radiology No results found.  Procedures Procedures (including critical care time)  Medications Ordered in UC Medications  ketorolac (TORADOL) 30 MG/ML injection 30 mg (30 mg Intramuscular Given 08/31/21 0946)    Initial Impression / Assessment and Plan / UC Course  I have reviewed the triage vital signs and the nursing notes.  Pertinent labs & imaging results that were available during my care of the patient were reviewed by me and considered in my medical decision making (see chart for details).     Vital signs and physical exam reassuring today; no indication for emergent evaluation or imaging.  Suspect symptoms are related to new onset migraine, however, patient does not have a history of migraines.  She denies any alarm symptoms and exam is normal so imaging was deferred.  Patient was given Toradol in clinic with improvement of symptoms.  Recommend she avoid NSAIDs for 12 hours but can use Tylenol for breakthrough pain.  Given history of eye swelling we did discuss that sinuses/periorbital cellulitis could be contributing to symptoms.  Will cover with Augmentin.  Recommended that she rest and drink plenty fluid.  She was prescribed baclofen for pain relief with instruction not to drive or drink alcohol with this medication as drowsiness is a common side effect.  She was provided a refill of Zofran to have on hand  for nausea and vomiting symptoms.  Discussed that she should follow-up with her primary care provider soon as possible but definitely within a week.  If she is unable to see them she can return here.  If anything worsens and she has a severe headache, weakness, confusion, nausea/vomiting or frequent oral intake, dysarthria, dizziness, visual disturbance, fever she needs to go immediately to the emergency room to which she expressed understanding.  Work excuse note was provided.  Final Clinical Impressions(s) / UC Diagnoses   Final diagnoses:  Nausea and vomiting, unspecified vomiting type  Migraine without aura and without status migrainosus, not intractable  Eye swelling, right     Discharge Instructions      I believe that you have a migraine but I am a little bit concerned since you had swelling around her eye that there might be an infection.  Please start Augmentin twice daily for 7 days.  Follow-up with your primary care provider within the week.  We gave you an injection of Toradol today.  Please do not take NSAIDs for 12 hours including aspirin, ibuprofen/Advil, naproxen/Aleve.  You can use Tylenol for breakthrough pain.  I have called in baclofen which helps with migraine headaches.  This will make you sleepy so do not drive or drink alcohol while taking it.  I refilled your Zofran to have this available.  Make sure that you are drinking plenty of fluids.  If you have any sudden severe pain, visual disturbance, nausea/vomiting interfering with oral intake you need to go to the emergency room.     ED Prescriptions     Medication Sig Dispense Auth. Provider   ondansetron (ZOFRAN-ODT) 4 MG disintegrating tablet Take 1 tablet (4 mg total) by mouth every 8 (eight) hours as needed for nausea or vomiting. 20 tablet Mellisa Arshad, Janye K, PA-C   amoxicillin-clavulanate (AUGMENTIN) 875-125 MG tablet Take 1 tablet  by mouth every 12 (twelve) hours. 14 tablet Higinio Grow, Jalon K, PA-C   baclofen (LIORESAL)  10 MG tablet Take 1 tablet (10 mg total) by mouth at bedtime as needed for muscle spasms. 5 each Kaarin Pardy, Noberto Retort, PA-C      PDMP not reviewed this encounter.   Journiee, Feldkamp, PA-C 08/31/21 1014

## 2021-08-31 NOTE — ED Triage Notes (Signed)
Pt POV reports swelling in right eye yesterday, improved but now vision is very blurry in right eye.

## 2021-08-31 NOTE — Discharge Instructions (Signed)
I believe that you have a migraine but I am a little bit concerned since you had swelling around her eye that there might be an infection.  Please start Augmentin twice daily for 7 days.  Follow-up with your primary care provider within the week.  We gave you an injection of Toradol today.  Please do not take NSAIDs for 12 hours including aspirin, ibuprofen/Advil, naproxen/Aleve.  You can use Tylenol for breakthrough pain.  I have called in baclofen which helps with migraine headaches.  This will make you sleepy so do not drive or drink alcohol while taking it.  I refilled your Zofran to have this available.  Make sure that you are drinking plenty of fluids.  If you have any sudden severe pain, visual disturbance, nausea/vomiting interfering with oral intake you need to go to the emergency room.

## 2021-08-31 NOTE — ED Triage Notes (Signed)
Pt reports yesterday had right eye swelling that is now little better. Also having right sided headache that is now coming across front of head. Denies blurred vision or drainage from eye. Reports taking Tylenol with no relief. This morning did vomit. Has light sensitivity.

## 2021-08-31 NOTE — Discharge Instructions (Signed)
Please call the ophthalmologist first thing in the morning to try and schedule an appointment either tomorrow or the next day.  Please return to the emergency department for rapid spreading redness if you develop a fever.

## 2021-09-01 ENCOUNTER — Emergency Department (HOSPITAL_COMMUNITY): Payer: No Typology Code available for payment source

## 2021-09-01 ENCOUNTER — Encounter (HOSPITAL_COMMUNITY): Payer: Self-pay

## 2021-09-01 ENCOUNTER — Other Ambulatory Visit: Payer: Self-pay

## 2021-09-01 ENCOUNTER — Emergency Department (HOSPITAL_COMMUNITY)
Admission: EM | Admit: 2021-09-01 | Discharge: 2021-09-01 | Disposition: A | Discharge status: Home / Self Care | Payer: No Typology Code available for payment source | Attending: Emergency Medicine | Admitting: Emergency Medicine

## 2021-09-01 DIAGNOSIS — H5711 Ocular pain, right eye: Secondary | ICD-10-CM | POA: Diagnosis not present

## 2021-09-01 DIAGNOSIS — H538 Other visual disturbances: Secondary | ICD-10-CM | POA: Diagnosis present

## 2021-09-01 LAB — BASIC METABOLIC PANEL
Anion gap: 8 (ref 5–15)
BUN: 11 mg/dL (ref 6–20)
CO2: 22 mmol/L (ref 22–32)
Calcium: 8.7 mg/dL — ABNORMAL LOW (ref 8.9–10.3)
Chloride: 108 mmol/L (ref 98–111)
Creatinine, Ser: 0.86 mg/dL (ref 0.44–1.00)
GFR, Estimated: >60 mL/min (ref >60)
Glucose, Bld: 85 mg/dL (ref 70–99)
Potassium: 3.9 mmol/L (ref 3.5–5.1)
Sodium: 138 mmol/L (ref 135–145)

## 2021-09-01 LAB — CBC
HCT: 38.1 % (ref 36.0–46.0)
Hemoglobin: 12.1 g/dL (ref 12.0–15.0)
MCH: 29.2 pg (ref 26.0–34.0)
MCHC: 31.8 g/dL (ref 30.0–36.0)
MCV: 92 fL (ref 80.0–100.0)
Platelets: 250 10*3/uL (ref 150–400)
RBC: 4.14 MIL/uL (ref 3.87–5.11)
RDW: 12.6 % (ref 11.5–15.5)
WBC: 6.3 10*3/uL (ref 4.0–10.5)
nRBC: 0 % (ref 0.0–0.2)

## 2021-09-01 MED ORDER — IOHEXOL 300 MG/ML  SOLN
75.0000 mL | Freq: Once | INTRAMUSCULAR | Status: AC | PRN
  Administered 2021-09-01: 75 mL via INTRAVENOUS

## 2021-09-01 MED ORDER — SODIUM CHLORIDE (PF) 0.9 % IJ SOLN
INTRAMUSCULAR | Status: AC
  Filled 2021-09-01: qty 50

## 2021-09-01 NOTE — ED Provider Notes (Signed)
[facility] DEPT Provider Note   CSN: [id number] Arrival date & time: 09/01/21  0815     History    Shannon Holloway is a 26 y.o. female.  HPI  Patient presents to the ED with a chief complaint of eye swelling and blurred vision.  Patient has a history of ADHD, depression, pancreatitis, status postcholecystectomy and hernia repair.  Patient also has pancreatic stents.  Patient states she noticed she started having some discomfort around her right eye and a pressure sensation behind her eye.  Patient went to an urgent care thought she might either have a migraine headache or an ocular infection she did have some eyelid swelling.  Patient was started on Augmentin.  Patient's symptoms progressed throughout the day so she went to bedside to the emergency room last evening.  Patient had exam that did not show any corneal abrasion.  She had normal intraocular pressures.  Patient was referred to an ophthalmologist.  When the patient woke up this morning she noticed more swelling of her eyelid.  She continues to have blurred vision in the right eye.  Patient did a virtual visit with a doctor who suggested she go back to the emergency room because they felt she could have an abscess behind her eye.  Home Medications Prior to Admission medications   Medication Sig Start Date End Date Taking? Authorizing Provider  amoxicillin-clavulanate (AUGMENTIN) 875-125 MG tablet Take 1 tablet by mouth every 12 (twelve) hours. 08/31/21   Raspet, Derry Skill, PA-C  baclofen (LIORESAL) 10 MG tablet Take 1 tablet (10 mg total) by mouth at bedtime as needed for muscle spasms. 08/31/21   Raspet, Junie Panning K, PA-C  ondansetron (ZOFRAN-ODT) 4 MG disintegrating tablet Take 1 tablet (4 mg total) by mouth every 8 (eight) hours as needed for nausea or vomiting. 08/31/21   Raspet, Junie Panning K, PA-C  buPROPion (WELLBUTRIN XL) 150 MG 24 hr tablet Take 1 tablet (150 mg total) by mouth daily. Patient not taking:  Reported on 12/21/2018 02/24/17 12/21/18  Merian Capron, MD  VYVANSE 20 MG capsule Take 1 capsule (20 mg total) by mouth every morning. Patient not taking: Reported on 11/24/2018 11/23/16 12/21/18  Merian Capron, MD      Allergies    Patient has no known allergies.    Review of Systems   Review of Systems  Constitutional:  Negative for fever.    Physical Exam Updated Vital Signs BP (!) 138/106   Pulse 97   Temp 99.8 F (37.7 C) (Oral)   Resp 16   Ht 1.702 m (5\' 7" )   Wt 85.7 kg   LMP 08/04/2021 (Exact Date)   SpO2 99%   BMI 29.60 kg/m  Physical Exam Vitals and nursing note reviewed.  Constitutional:      General: She is not in acute distress.    Appearance: She is well-developed.  HENT:     Head: Normocephalic and atraumatic.     Right Ear: External ear normal.     Left Ear: External ear normal.  Eyes:     General: No scleral icterus.       Right eye: No discharge.        Left eye: No discharge.     Extraocular Movements:     Right eye: Normal extraocular motion.     Conjunctiva/sclera:     Right eye: Right conjunctiva is injected.     Comments: Mild edema right periorbital region and upper eyelid  Neck:  Trachea: No tracheal deviation.  Cardiovascular:     Rate and Rhythm: Normal rate.  Pulmonary:     Effort: Pulmonary effort is normal. No respiratory distress.     Breath sounds: No stridor.  Abdominal:     General: There is no distension.  Musculoskeletal:        General: No swelling or deformity.     Cervical back: Neck supple.  Skin:    General: Skin is warm and dry.     Findings: No rash.  Neurological:     Mental Status: She is alert.     Cranial Nerves: Cranial nerve deficit: no gross deficits.     ED Results / Procedures / Treatments   Labs (all labs ordered are listed, but only abnormal results are displayed) Labs Reviewed  BASIC METABOLIC PANEL - Abnormal; Notable for the following components:      Result Value   Calcium 8.7 (*)    All  other components within normal limits  CBC    EKG None  Radiology CT Orbits W Contrast  Result Date: 09/01/2021 CLINICAL DATA:  Right eye pain and pressure for 2 days common blurry vision since yesterday EXAM: CT ORBITS WITH CONTRAST TECHNIQUE: Multidetector CT images was performed according to the standard protocol following intravenous contrast administration. RADIATION DOSE REDUCTION: This exam was performed according to the departmental dose-optimization program which includes automated exposure control, adjustment of the mA and/or kV according to patient size and/or use of iterative reconstruction technique. CONTRAST:  58mL OMNIPAQUE IOHEXOL 300 MG/ML  SOLN COMPARISON:  None Available. FINDINGS: Orbits: Right: The globe is intact. The extraocular muscles are normal. The orbital fat is preserved, without inflammatory change or abscess. There is no mass lesion. The optic nerve is normal. The lacrimal gland is normal. Left: The globe is intact. The extraocular muscles are normal. The orbital fat is preserved, without inflammatory change or abscess. There is no mass lesion. The optic nerve is normal. The lacrimal gland is normal. Visible paranasal sinuses: There is a small retention cyst in the left maxillary sinus. Soft tissues: Unremarkable. There is no appreciable periorbital soft tissue swelling. There is no evidence of abscess. Osseous: There is no acute osseous abnormality or suspicious osseous lesion. Limited intracranial: The imaged intracranial compartment is unremarkable. IMPRESSION: Unremarkable CT appearance of the globes and orbits. No evidence of orbital cellulitis. Electronically Signed   By: Valetta Mole M.D.   On: 09/01/2021 10:21    Procedures Procedures    Medications Ordered in ED Medications  sodium chloride (PF) 0.9 % injection (has no administration in time range)  iohexol (OMNIPAQUE) 300 MG/ML solution 75 mL (75 mLs Intravenous Contrast Given 09/01/21 0954)    ED Course/  Medical Decision Making/ A&P Clinical Course as of 09/01/21 1038  Tue Sep 01, 2021  1027 CBC Normal [JK]  [id number] Basic metabolic panel(!) Normal [JK]  1028 CT Orbits W Contrast CT images and radiology report reviewed.  No acute findings, no evidence of orbital cellulitis [JK]    Clinical Course User Index [JK] Dorie Rank, MD                           Medical Decision Making DDx includes but not limited to periorbital cellulitis, orbital cellulitis, rerobulbar abscess, iritis.  Will plan on CT imaging.  Yesterday without signs of glaucoma, corneal abrasion, corneal ulcer  Problems Addressed: Blurred vision: acute illness or injury that poses a threat to life  or bodily functions Pain of right eye: acute illness or injury that poses a threat to life or bodily functions  Amount and/or Complexity of Data Reviewed Labs: ordered. Decision-making details documented in ED Course. Radiology: ordered and independent interpretation performed. Decision-making details documented in ED Course.  Risk Prescription drug management.   Patient presented to ED with complaints of persistent blurred vision for the last couple of days.  Patient was seen in the ED yesterday and referred to ophthalmology.  Patient came to the ED today because she had a televisit and the provider was concerned that she might have an orbital cellulitis orbital abscess.  Patient does have decreased visual acuity in that eye.  She does have evidence of conjunctival erythema.  CT scan was performed and does not show any signs of orbital cellulitis or mass.  I am concerned the patient may be having issues with an iritis with her complaints of blurred vision and eye discomfort in right eye.  Patient did contact the ophthalmologist today while she is waiting.  She does have an outpatient follow-up appointment scheduled.  I will have her continue the antibiotics at this time I think the most important neck step is having her see the eye  doctor as planned.  Evaluation and diagnostic testing in the emergency department does not suggest an emergent condition requiring admission or immediate intervention beyond what has been performed at this time.  The patient is safe for discharge and has been instructed to return immediately for worsening symptoms, change in symptoms or any other concerns.         Final Clinical Impression(s) / ED Diagnoses Final diagnoses:  Pain of right eye  Blurred vision    Rx / DC Orders ED Discharge Orders     None         Dorie Rank, MD 09/01/21 1038

## 2021-09-01 NOTE — Discharge Instructions (Addendum)
The CT scan did not show any signs of abscess or orbital cellulitis.  Follow-up with Dr. Tobe Sos today for further evaluation as planned.  Continue the current medications for now

## 2021-09-01 NOTE — ED Triage Notes (Signed)
Patient c/o right eye pain and pressure x 2 days. Patient also c/o blurry vision since 1600 yesterday.  Patient denies any drainage. Patient states she did a virtual visit with her eye doctor and was told to come to an ED for further evaluation.

## 2021-09-04 ENCOUNTER — Emergency Department (INDEPENDENT_AMBULATORY_CARE_PROVIDER_SITE_OTHER)
Admission: EM | Admit: 2021-09-04 | Discharge: 2021-09-04 | Disposition: A | Discharge status: Home / Self Care | Payer: No Typology Code available for payment source | Source: Home / Self Care

## 2021-09-04 DIAGNOSIS — H5711 Ocular pain, right eye: Secondary | ICD-10-CM | POA: Diagnosis not present

## 2021-09-04 DIAGNOSIS — H11441 Conjunctival cysts, right eye: Secondary | ICD-10-CM

## 2021-09-04 DIAGNOSIS — H5789 Other specified disorders of eye and adnexa: Secondary | ICD-10-CM | POA: Diagnosis not present

## 2021-09-04 MED ORDER — KETOROLAC TROMETHAMINE 30 MG/ML IJ SOLN
30.0000 mg | Freq: Once | INTRAMUSCULAR | Status: AC
  Administered 2021-09-04: 30 mg via INTRAMUSCULAR

## 2021-09-04 NOTE — Discharge Instructions (Addendum)
Toradol injection given to help with pain. You need to see the ophthalmologist again given your worsening symptoms. If you are unable to see an ophthalmologist recommend going to the ER where they should have a specialist on call.

## 2021-09-04 NOTE — ED Triage Notes (Signed)
Pt c/o RT eye swelling since Sunday. UC on Monday, given toradol and abx. Also seen in ED same day, had CT which was normal. Rx'd steroid eye drops by [facility] on Tues. Started yesterday. Pain 8/10

## 2021-09-04 NOTE — ED Provider Notes (Signed)
[facility]    CSN: [id number] Arrival date & time: 09/04/21  1024      History   Chief Complaint Chief Complaint  Patient presents with   Eye Problem    RT    HPI Shannon Holloway is a 26 y.o. female.   Patient presents with concerns of worsening right eye pain, swelling, and blurry vision. She reports the symptoms started on Sunday. Since then she has been seen multiple times by urgent care, the ER, and an ophthalmologist. She was initially given a Toradol injection and Augmentin by urgent care for migraine and possible R eye infection due to the eyelid swelling. She did not improve and has subsequently had a negative CT, normal pressure testing, and negative fluoroscein exam in the ER. She saw an ophthalmologist Tuesday who prescribed steroid drops and was told it was lacrimal gland swelling. 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 She states since using the drops her symptoms seem to be worsening. She continues to have blurry vision in the right eye and reports the swelling around her eye seems to be getting worse again. She reports this morning the pain was worse, up to 12/10. The patient has tried calling the ophthalmologist she saw and they just told her to continue the drops. She is concerned because she has noticed a growth/swelling of her actual eyeball over the past few hours that seems to be getting worse. She has tried ibuprofen with minimal improvement in the pain. She denies known fever or feeling unwell other than her eye.   The history is provided by the patient.  Eye Problem Associated symptoms: redness   Associated symptoms: no discharge and no photophobia     Past Medical History:  Diagnosis Date   ADHD (attention deficit hyperactivity disorder)    Anxiety    Depression    Pancreatitis 2012    Patient Active Problem List   Diagnosis Date Noted   Acute pancreatitis 01/18/2021    Acute pancreatitis after endoscopic retrograde cholangiopancreatography (ERCP) 01/17/2021   Pancreatitis 08/17/2020   Tobacco abuse 08/17/2020   History of panic attacks 05/19/2016   Major depressive disorder 05/19/2016    Past Surgical History:  Procedure Laterality Date   CHOLECYSTECTOMY  2012   HERNIA REPAIR  2001    OB History   No obstetric history on file.      Home Medications    Prior to Admission medications   Medication Sig Start Date End Date Taking? Authorizing Provider  amoxicillin-clavulanate (AUGMENTIN) 875-125 MG tablet Take 1 tablet by mouth every 12 (twelve) hours. 08/31/21   Raspet, Noberto Retort, PA-C  baclofen (LIORESAL) 10 MG tablet Take 1 tablet (10 mg total) by mouth at bedtime as needed for muscle spasms. 08/31/21   Raspet, Denny Peon K, PA-C  ondansetron (ZOFRAN-ODT) 4 MG disintegrating tablet Take 1 tablet (4 mg total) by mouth every 8 (eight) hours as needed for nausea or vomiting. 08/31/21   Raspet, Denny Peon K, PA-C  buPROPion (WELLBUTRIN XL) 150 MG 24 hr tablet Take 1 tablet (150 mg total) by mouth daily. Patient not taking: Reported on 12/21/2018 02/24/17 12/21/18  Thresa Ross, MD  VYVANSE 20 MG capsule Take 1 capsule (20 mg total) by mouth every morning. Patient not taking: Reported on 11/24/2018 11/23/16 12/21/18  Thresa Ross, MD    Family History Family History  Problem Relation Age of Onset   Anxiety disorder Mother  Anxiety disorder Maternal Grandmother     Social History Social History   Tobacco Use   Smoking status: Never   Smokeless tobacco: Never  Substance Use Topics   Alcohol use: Yes    Comment: social   Drug use: Yes    Types: Marijuana     Allergies   Patient has no known allergies.   Review of Systems Review of Systems  Constitutional:  Negative for fever.  HENT:  Negative for congestion.   Eyes:  Positive for pain, redness and visual disturbance. Negative for photophobia and discharge.  Respiratory:  Negative for shortness of  breath.   Skin:  Negative for rash and wound.  Neurological:  Negative for dizziness.     Physical Exam Triage Vital Signs ED Triage Vitals  Enc Vitals Group     BP 09/04/21 1038 (!) 143/96     Pulse Rate 09/04/21 1038 82     Resp 09/04/21 1038 17     Temp 09/04/21 1038 99.1 F (37.3 C)     Temp Source 09/04/21 1038 Oral     SpO2 09/04/21 1038 98 %     Weight --      Height --      Head Circumference --      Peak Flow --      Pain Score 09/04/21 1039 8     Pain Loc --      Pain Edu? --      Excl. in GC? --    No data found.  Updated Vital Signs BP (!) 143/96 (BP Location: Left Arm)   Pulse 82   Temp 99.1 F (37.3 C) (Oral)   Resp 17   LMP 08/04/2021 (Exact Date)   SpO2 98%   Visual Acuity Right Eye Distance:   Left Eye Distance:   Bilateral Distance:    Right Eye Near:   Left Eye Near:    Bilateral Near:     Physical Exam Vitals and nursing note reviewed.  HENT:     Head: Normocephalic and atraumatic.  Eyes:     Comments: R upper and lower eyelids with mild to moderate swelling, no significant erythema noted. Mild injection R conjunctiva, mainly lateral aspect, with overlying clear accumulation appearing consistent with a conjunctival cyst covering most of the R lateral conjunctiva. Tenderness to lower eyelid region and eyeball with light pressure applied over closed lids. EOM intact.   Neurological:     Mental Status: She is alert.     Gait: Gait normal.  Psychiatric:        Mood and Affect: Mood is anxious. Affect is tearful.      UC Treatments / Results  Labs (all labs ordered are listed, but only abnormal results are displayed) Labs Reviewed - No data to display  EKG   Radiology No results found.  Procedures Procedures (including critical care time)  Medications Ordered in UC Medications  ketorolac (TORADOL) 30 MG/ML injection 30 mg (30 mg Intramuscular Given 09/04/21 1103)    Initial Impression / Assessment and Plan / UC Course  I  have reviewed the triage vital signs and the nursing notes.  Pertinent labs & imaging results that were available during my care of the patient were reviewed by me and considered in my medical decision making (see chart for details).     Requires higher level of ophthalmology specialty evaluation and treatment than available in urgent care. Concern for worsening pain along with blurred vision - along with new onset  of what appears to be a conjunctival cyst. Advised patient to continue to call ophthalmologist she saw to get in with them emergently today, or go to the ER if unable to see specialist outpatient. Toradol injection given to help with severe pain while awaiting definitive tx of eye.  E/M: 1 acute complicated illness, no data, moderate risk due to prescription management  Final Clinical Impressions(s) / UC Diagnoses   Final diagnoses:  Acute right eye pain  Swelling of right eye  Conjunctival cyst of right eye     Discharge Instructions      Toradol injection given to help with pain. You need to see the ophthalmologist again given your worsening symptoms. If you are unable to see an ophthalmologist recommend going to the ER where they should have a specialist on call.     ED Prescriptions   None    PDMP not reviewed this encounter.   Delsa Sale, [place] 09/04/21 1115

## 2021-09-15 ENCOUNTER — Encounter (HOSPITAL_BASED_OUTPATIENT_CLINIC_OR_DEPARTMENT_OTHER): Payer: Self-pay

## 2021-09-15 ENCOUNTER — Emergency Department (HOSPITAL_BASED_OUTPATIENT_CLINIC_OR_DEPARTMENT_OTHER)
Admission: EM | Admit: 2021-09-15 | Discharge: 2021-09-15 | Disposition: A | Discharge status: Home / Self Care | Payer: No Typology Code available for payment source | Attending: Emergency Medicine | Admitting: Emergency Medicine

## 2021-09-15 ENCOUNTER — Emergency Department (HOSPITAL_BASED_OUTPATIENT_CLINIC_OR_DEPARTMENT_OTHER): Payer: No Typology Code available for payment source

## 2021-09-15 DIAGNOSIS — R1011 Right upper quadrant pain: Secondary | ICD-10-CM | POA: Diagnosis present

## 2021-09-15 DIAGNOSIS — K859 Acute pancreatitis without necrosis or infection, unspecified: Secondary | ICD-10-CM | POA: Diagnosis not present

## 2021-09-15 LAB — URINALYSIS, ROUTINE W REFLEX MICROSCOPIC
Bilirubin Urine: NEGATIVE
Glucose, UA: NEGATIVE mg/dL
Hgb urine dipstick: NEGATIVE
Ketones, ur: NEGATIVE mg/dL
Leukocytes,Ua: NEGATIVE
Nitrite: NEGATIVE
Protein, ur: NEGATIVE mg/dL
Specific Gravity, Urine: 1.02 (ref 1.005–1.030)
pH: 8.5 — ABNORMAL HIGH (ref 5.0–8.0)

## 2021-09-15 LAB — CBC WITH DIFFERENTIAL/PLATELET
Abs Immature Granulocytes: 0.01 10*3/uL (ref 0.00–0.07)
Basophils Absolute: 0 10*3/uL (ref 0.0–0.1)
Basophils Relative: 1 %
Eosinophils Absolute: 0.1 10*3/uL (ref 0.0–0.5)
Eosinophils Relative: 1 %
HCT: 38.6 % (ref 36.0–46.0)
Hemoglobin: 12.5 g/dL (ref 12.0–15.0)
Immature Granulocytes: 0 %
Lymphocytes Relative: 41 %
Lymphs Abs: 1.7 10*3/uL (ref 0.7–4.0)
MCH: 29.4 pg (ref 26.0–34.0)
MCHC: 32.4 g/dL (ref 30.0–36.0)
MCV: 90.8 fL (ref 80.0–100.0)
Monocytes Absolute: 0.2 10*3/uL (ref 0.1–1.0)
Monocytes Relative: 5 %
Neutro Abs: 2.1 10*3/uL (ref 1.7–7.7)
Neutrophils Relative %: 52 %
Platelets: 225 10*3/uL (ref 150–400)
RBC: 4.25 MIL/uL (ref 3.87–5.11)
RDW: 12.4 % (ref 11.5–15.5)
WBC: 4.2 10*3/uL (ref 4.0–10.5)
nRBC: 0 % (ref 0.0–0.2)

## 2021-09-15 LAB — COMPREHENSIVE METABOLIC PANEL
ALT: 10 U/L (ref 0–44)
AST: 12 U/L — ABNORMAL LOW (ref 15–41)
Albumin: 3.8 g/dL (ref 3.5–5.0)
Alkaline Phosphatase: 50 U/L (ref 38–126)
Anion gap: 5 (ref 5–15)
BUN: 9 mg/dL (ref 6–20)
CO2: 25 mmol/L (ref 22–32)
Calcium: 9 mg/dL (ref 8.9–10.3)
Chloride: 108 mmol/L (ref 98–111)
Creatinine, Ser: 0.94 mg/dL (ref 0.44–1.00)
GFR, Estimated: >60 mL/min (ref >60)
Glucose, Bld: 93 mg/dL (ref 70–99)
Potassium: 4 mmol/L (ref 3.5–5.1)
Sodium: 138 mmol/L (ref 135–145)
Total Bilirubin: 0.5 mg/dL (ref 0.3–1.2)
Total Protein: 7.7 g/dL (ref 6.5–8.1)

## 2021-09-15 LAB — LIPASE, BLOOD: Lipase: 144 U/L — ABNORMAL HIGH (ref 11–51)

## 2021-09-15 LAB — PREGNANCY, URINE: Preg Test, Ur: NEGATIVE

## 2021-09-15 MED ORDER — IOHEXOL 300 MG/ML  SOLN
100.0000 mL | Freq: Once | INTRAMUSCULAR | Status: AC | PRN
  Administered 2021-09-15: 100 mL via INTRAVENOUS

## 2021-09-15 MED ORDER — METOCLOPRAMIDE HCL 5 MG/ML IJ SOLN
5.0000 mg | Freq: Once | INTRAMUSCULAR | Status: AC
  Administered 2021-09-15: 5 mg via INTRAVENOUS
  Filled 2021-09-15: qty 2

## 2021-09-15 MED ORDER — HYDROMORPHONE HCL 1 MG/ML IJ SOLN
0.5000 mg | Freq: Once | INTRAMUSCULAR | Status: AC
  Administered 2021-09-15: 0.5 mg via INTRAVENOUS
  Filled 2021-09-15: qty 1

## 2021-09-15 MED ORDER — FENTANYL CITRATE PF 50 MCG/ML IJ SOSY
25.0000 ug | PREFILLED_SYRINGE | Freq: Once | INTRAMUSCULAR | Status: AC
  Administered 2021-09-15: 25 ug via INTRAVENOUS
  Filled 2021-09-15: qty 1

## 2021-09-15 MED ORDER — FENTANYL CITRATE PF 50 MCG/ML IJ SOSY
50.0000 ug | PREFILLED_SYRINGE | Freq: Once | INTRAMUSCULAR | Status: AC
  Administered 2021-09-15: 50 ug via INTRAVENOUS
  Filled 2021-09-15: qty 1

## 2021-09-15 NOTE — ED Notes (Signed)
Pt. Has saltines and ginger ale at bedside.

## 2021-09-15 NOTE — ED Provider Notes (Signed)
[facility] EMERGENCY DEPARTMENT Provider Note   CSN: [id number] Arrival date & time: 09/15/21  1110     History  Chief Complaint  Patient presents with   Abdominal Pain    Shannon Holloway is a 26 y.o. female.  Presented to the emergency room due to concern for abdominal pain.  Patient states that she has a history of pancreatitis.  Feels similar but somewhat different than her regular pancreatitis flares.  Central, right upper abdominal pain.  Has history of cholecystectomy.  Had a little bit of nausea earlier but currently no nausea.  Pain up to 6 out of 10 in severity.  Took some Zofran prior to arrival.  No fevers or chills.  HPI     Home Medications Prior to Admission medications   Medication Sig Start Date End Date Taking? Authorizing Provider  amoxicillin-clavulanate (AUGMENTIN) 875-125 MG tablet Take 1 tablet by mouth every 12 (twelve) hours. 08/31/21   Raspet, Noberto Retort, PA-C  baclofen (LIORESAL) 10 MG tablet Take 1 tablet (10 mg total) by mouth at bedtime as needed for muscle spasms. 08/31/21   Raspet, Denny Peon K, PA-C  ondansetron (ZOFRAN-ODT) 4 MG disintegrating tablet Take 1 tablet (4 mg total) by mouth every 8 (eight) hours as needed for nausea or vomiting. 08/31/21   Raspet, Denny Peon K, PA-C  buPROPion (WELLBUTRIN XL) 150 MG 24 hr tablet Take 1 tablet (150 mg total) by mouth daily. Patient not taking: Reported on 12/21/2018 02/24/17 12/21/18  Thresa Ross, MD  VYVANSE 20 MG capsule Take 1 capsule (20 mg total) by mouth every morning. Patient not taking: Reported on 11/24/2018 11/23/16 12/21/18  Thresa Ross, MD      Allergies    Patient has no known allergies.    Review of Systems   Review of Systems  Constitutional:  Negative for chills and fever.  HENT:  Negative for ear pain and sore throat.   Eyes:  Negative for pain and visual disturbance.  Respiratory:  Negative for cough and shortness of breath.   Cardiovascular:  Negative for chest pain and palpitations.   Gastrointestinal:  Positive for abdominal pain and nausea. Negative for vomiting.  Genitourinary:  Negative for dysuria and hematuria.  Musculoskeletal:  Negative for arthralgias and back pain.  Skin:  Negative for color change and rash.  Neurological:  Negative for seizures and syncope.  All other systems reviewed and are negative.   Physical Exam Updated Vital Signs BP (!) 142/88 (BP Location: Left Arm)   Pulse 61   Temp 98.1 F (36.7 C) (Oral)   Resp 17   Ht 5\' 7"  (1.702 m)   Wt 83 kg   LMP 09/03/2021 (Exact Date)   SpO2 100%   BMI 28.66 kg/m  Physical Exam Vitals and nursing note reviewed.  Constitutional:      General: She is not in acute distress.    Appearance: She is well-developed.  HENT:     Head: Normocephalic and atraumatic.  Eyes:     Conjunctiva/sclera: Conjunctivae normal.  Cardiovascular:     Rate and Rhythm: Normal rate and regular rhythm.     Heart sounds: No murmur heard. Pulmonary:     Effort: Pulmonary effort is normal. No respiratory distress.     Breath sounds: Normal breath sounds.  Abdominal:     Palpations: Abdomen is soft.     Tenderness: There is abdominal tenderness in the epigastric area. There is no guarding or rebound.  Musculoskeletal:  General: No swelling.     Cervical back: Neck supple.  Skin:    General: Skin is warm and dry.     Capillary Refill: Capillary refill takes less than 2 seconds.  Neurological:     Mental Status: She is alert.  Psychiatric:        Mood and Affect: Mood normal.     ED Results / Procedures / Treatments   Labs (all labs ordered are listed, but only abnormal results are displayed) Labs Reviewed  COMPREHENSIVE METABOLIC PANEL - Abnormal; Notable for the following components:      Result Value   AST 12 (*)    All other components within normal limits  LIPASE, BLOOD - Abnormal; Notable for the following components:   Lipase 144 (*)    All other components within normal limits  URINALYSIS,  ROUTINE W REFLEX MICROSCOPIC - Abnormal; Notable for the following components:   pH 8.5 (*)    All other components within normal limits  CBC WITH DIFFERENTIAL/PLATELET  PREGNANCY, URINE    EKG None  Radiology CT ABDOMEN PELVIS W CONTRAST  Result Date: 09/15/2021 CLINICAL DATA:  Right upper quadrant abdominal pain. History of pancreatitis EXAM: CT ABDOMEN AND PELVIS WITH CONTRAST TECHNIQUE: Multidetector CT imaging of the abdomen and pelvis was performed using the standard protocol following bolus administration of intravenous contrast. RADIATION DOSE REDUCTION: This exam was performed according to the departmental dose-optimization program which includes automated exposure control, adjustment of the mA and/or kV according to patient size and/or use of iterative reconstruction technique. CONTRAST:  OMNIPAQUE IOHEXOL 300 MG/ML  SOLN COMPARISON:  01/17/2021 FINDINGS: Lower chest: Included lung bases are clear.  Heart size is normal. Hepatobiliary: No focal liver abnormality is seen. Status post cholecystectomy. No biliary dilatation. Pancreas: Stable enlargement of the pancreatic head and uncinate process. Indwelling pancreatic drain with pigtail position within the proximal duodenum. 13 mm peripancreatic cyst anteriorly along the pancreatic head (series 2, image 30) is slightly less conspicuous on the current exam, however is unchanged in size. Minimal peripancreatic fat stranding in the region of the pancreatic head. No free fluid or new fluid collections. Spleen: Normal in size without focal abnormality. Adrenals/Urinary Tract: Unremarkable adrenal glands. Kidneys enhance symmetrically without focal lesion, stone, or hydronephrosis. Ureters are nondilated. Urinary bladder appears unremarkable for the degree of distension. Stomach/Bowel: Stomach is within normal limits. Appendix is unremarkable (series 2, image 70). No evidence of bowel wall thickening, distention, or inflammatory changes.  Vascular/Lymphatic: No significant vascular findings are present. No enlarged abdominal or pelvic lymph nodes. Reproductive: Uterus and bilateral adnexa are unremarkable. Other: Trace free fluid within the cul-de-sac. No pneumoperitoneum. No abdominal wall hernia. Musculoskeletal: No acute or significant osseous findings. IMPRESSION: 1. Stable enlargement of the pancreatic head and uncinate process with minimal peripancreatic fat stranding suggesting mild acute on chronic pancreatitis. Indwelling pancreatic drain with pigtail position within the proximal duodenum. 2. Stable size of a 13 mm peripancreatic cyst anteriorly along the pancreatic head. 3. Trace free fluid within the cul-de-sac, which may be physiologic. Electronically Signed   By: Duanne Guess D.O.   On: 09/15/2021 12:44    Procedures Procedures    Medications Ordered in ED Medications  metoCLOPramide (REGLAN) injection 5 mg (5 mg Intravenous Given 09/15/21 1208)  fentaNYL (SUBLIMAZE) injection 50 mcg (50 mcg Intravenous Given 09/15/21 1209)  iohexol (OMNIPAQUE) 300 MG/ML solution 100 mL (100 mLs Intravenous Contrast Given 09/15/21 1216)  fentaNYL (SUBLIMAZE) injection 25 mcg (25 mcg Intravenous Given 09/15/21  1509)  HYDROmorphone (DILAUDID) injection 0.5 mg (0.5 mg Intravenous Given 09/15/21 1717)    ED Course/ Medical Decision Making/ A&P                           Medical Decision Making Amount and/or Complexity of Data Reviewed Labs: ordered. Radiology: ordered.  Risk Prescription drug management.   26 year old presented to ER due to concern for upper abdominal pain, on exam well-appearing no distress, noted some tenderness in her epigastric region.  Given prior history of pancreatitis, check labs, CT to assess further.  No leukocytosis, no transaminitis, no electrolyte derangement, no AKI.  There is mild elevation in lipase.  CT scan consistent with mild acute pancreatitis.  No new acute complications.  I independently  reviewed and interpreted CT imaging results and agree with radiology report.  Reassessed patient after pain, antiemetic.  She is feeling markedly improved.  She tolerated small amount of p.o. without any difficulty or recurrence of her pain.  I advised bland diet, primarily liquids today and slowly reintroduce solids.  Advise follow-up with her GI.  Reviewed return precautions.  Given adequate symptom control and well appearance and reassuring labs, feel patient is appropriate for outpatient management and does not require admission at this time.  After the discussed management above, the patient was determined to be safe for discharge.  The patient was in agreement with this plan and all questions regarding their care were answered.  ED return precautions were discussed and the patient will return to the ED with any significant worsening of condition.        Final Clinical Impression(s) / ED Diagnoses Final diagnoses:  Acute pancreatitis, unspecified complication status, unspecified pancreatitis type    Rx / DC Orders ED Discharge Orders     None         Milagros Loll, MD 09/16/21 [phone]

## 2021-09-15 NOTE — ED Notes (Signed)
Patient transported to CT 

## 2021-09-18 ENCOUNTER — Encounter: Payer: Self-pay | Admitting: Emergency Medicine

## 2021-09-18 ENCOUNTER — Other Ambulatory Visit: Payer: Self-pay

## 2021-09-18 ENCOUNTER — Emergency Department (INDEPENDENT_AMBULATORY_CARE_PROVIDER_SITE_OTHER)
Admission: EM | Admit: 2021-09-18 | Discharge: 2021-09-18 | Disposition: A | Discharge status: Home / Self Care | Payer: No Typology Code available for payment source | Source: Home / Self Care

## 2021-09-18 DIAGNOSIS — R310 Gross hematuria: Secondary | ICD-10-CM | POA: Diagnosis not present

## 2021-09-18 LAB — POCT URINALYSIS DIP (MANUAL ENTRY)
Bilirubin, UA: NEGATIVE
Glucose, UA: NEGATIVE mg/dL
Ketones, POC UA: NEGATIVE mg/dL
Leukocytes, UA: NEGATIVE
Nitrite, UA: NEGATIVE
Protein Ur, POC: NEGATIVE mg/dL
Spec Grav, UA: 1.02 (ref 1.010–1.025)
Urobilinogen, UA: 1 E.U./dL
pH, UA: 8 (ref 5.0–8.0)

## 2021-09-18 LAB — POCT URINE PREGNANCY: Preg Test, Ur: NEGATIVE

## 2021-09-18 MED ORDER — SULFAMETHOXAZOLE-TRIMETHOPRIM 800-160 MG PO TABS: 1.0000 | ORAL_TABLET | Freq: Two times a day (BID) | ORAL | 0 refills | Status: AC

## 2021-09-18 NOTE — Discharge Instructions (Addendum)
Instructed patient to take medication as directed with food to completion.  Encouraged patient to increase daily water intake while taking this medication.  Advised patient we will follow-up with urine culture results once received. Advised patient if symptoms worsen and/or unresolved please follow-up with PCP or here for further evaluation.

## 2021-09-18 NOTE — ED Triage Notes (Signed)
Hematuria this morning, cramping started this morning. Has chronic pancreatitis was hospitalized on Tuesday with a flare-up.

## 2021-09-18 NOTE — ED Provider Notes (Signed)
[facility]    CSN: [id number] Arrival date & time: 09/18/21  1218      History   Chief Complaint Chief Complaint  Patient presents with   Hematuria    HPI Shannon Holloway is a 26 y.o. female.   HPI Pleasant 26 year old female presents with hematuria this morning and cramping.  Patient has history of chronic pancreatitis and was hospitalized on Tuesday of this week with flareup.  Past Medical History:  Diagnosis Date   ADHD (attention deficit hyperactivity disorder)    Anxiety    Depression    Pancreatitis 2012    Patient Active Problem List   Diagnosis Date Noted   Acute pancreatitis 01/18/2021   Acute pancreatitis after endoscopic retrograde cholangiopancreatography (ERCP) 01/17/2021   Pancreatitis 08/17/2020   Tobacco abuse 08/17/2020   History of panic attacks 05/19/2016   Major depressive disorder 05/19/2016    Past Surgical History:  Procedure Laterality Date   CHOLECYSTECTOMY  2012   HERNIA REPAIR  2001    OB History   No obstetric history on file.      Home Medications    Prior to Admission medications   Medication Sig Start Date End Date Taking? Authorizing Provider  norelgestromin-ethinyl estradiol [facility]) 150-35 MCG/24HR transdermal patch Place 1 patch onto the skin once a week.   Yes [provider]  sulfamethoxazole-trimethoprim (BACTRIM DS) 800-160 MG tablet Take 1 tablet by mouth 2 (two) times daily for 3 days. 09/18/21 09/21/21 Yes Trevor Iha, FNP  buPROPion (WELLBUTRIN XL) 150 MG 24 hr tablet Take 1 tablet (150 mg total) by mouth daily. Patient not taking: Reported on 12/21/2018 02/24/17 12/21/18  Thresa Ross, MD  VYVANSE 20 MG capsule Take 1 capsule (20 mg total) by mouth every morning. Patient not taking: Reported on 11/24/2018 11/23/16 12/21/18  Thresa Ross, MD    Family History Family History  Problem Relation Age of Onset   Anxiety disorder Mother    Anxiety disorder Maternal Grandmother     Social  History Social History   Tobacco Use   Smoking status: Never   Smokeless tobacco: Never  Vaping Use   Vaping Use: Never used  Substance Use Topics   Alcohol use: Yes    Comment: social   Drug use: Yes    Types: Marijuana     Allergies   Patient has no known allergies.   Review of Systems Review of Systems  Genitourinary:  Positive for hematuria.  All other systems reviewed and are negative.    Physical Exam Triage Vital Signs ED Triage Vitals  Enc Vitals Group     BP 09/18/21 1247 135/85     Pulse Rate 09/18/21 1247 83     Resp 09/18/21 1247 16     Temp 09/18/21 1247 98.9 F (37.2 C)     Temp Source 09/18/21 1247 Oral     SpO2 09/18/21 1247 99 %     Weight 09/18/21 1248 183 lb (83 kg)     Height 09/18/21 1248 5\' 7"  (1.702 m)     Head Circumference --      Peak Flow --      Pain Score 09/18/21 1248 4     Pain Loc --      Pain Edu? --      Excl. in GC? --    No data found.  Updated Vital Signs BP 135/85 (BP Location: Left Arm)   Pulse 83   Temp 98.9 F (37.2 C) (Oral)   Resp  16   Ht 5\' 7"  (1.702 m)   Wt 183 lb (83 kg)   LMP 09/03/2021 (Exact Date)   SpO2 99%   BMI 28.66 kg/m   Physical Exam Vitals and nursing note reviewed.  Constitutional:      Appearance: Normal appearance. She is normal weight.  HENT:     Head: Normocephalic and atraumatic.     Mouth/Throat:     Mouth: Mucous membranes are moist.     Pharynx: Oropharynx is clear.  Eyes:     Extraocular Movements: Extraocular movements intact.     Conjunctiva/sclera: Conjunctivae normal.     Pupils: Pupils are equal, round, and reactive to light.  Cardiovascular:     Rate and Rhythm: Normal rate and regular rhythm.     Pulses: Normal pulses.     Heart sounds: Normal heart sounds.  Pulmonary:     Effort: Pulmonary effort is normal.     Breath sounds: Normal breath sounds. No wheezing, rhonchi or rales.  Musculoskeletal:     Cervical back: Normal range of motion and neck supple.   Skin:    General: Skin is warm and dry.  Neurological:     General: No focal deficit present.     Mental Status: She is alert and oriented to person, place, and time. Mental status is at baseline.      UC Treatments / Results  Labs (all labs ordered are listed, but only abnormal results are displayed) Labs Reviewed  POCT URINALYSIS DIP (MANUAL ENTRY) - Abnormal; Notable for the following components:      Result Value   Blood, UA moderate (*)    All other components within normal limits  POCT URINE PREGNANCY    EKG   Radiology No results found.  Procedures Procedures (including critical care time)  Medications Ordered in UC Medications - No data to display  Initial Impression / Assessment and Plan / UC Course  I have reviewed the triage vital signs and the nursing notes.  Pertinent labs & imaging results that were available during my care of the patient were reviewed by me and considered in my medical decision making (see chart for details).     MDM: 1.  Hematuria-Rx'd Bactrim, urine culture ordered. Instructed patient to take medication as directed with food to completion.  Encouraged patient to increase daily water intake while taking this medication.  Advised patient we will follow-up with urine culture results once received. Advised patient if symptoms worsen and/or unresolved please follow-up with PCP or here for further evaluation. Patient discharged home, hemodynamically stable.  Final Clinical Impressions(s) / UC Diagnoses   Final diagnoses:  Gross hematuria     Discharge Instructions      Instructed patient to take medication as directed with food to completion.  Encouraged patient to increase daily water intake while taking this medication.  Advised patient we will follow-up with urine culture results once received. Advised patient if symptoms worsen and/or unresolved please follow-up with PCP or here for further evaluation.     ED Prescriptions      Medication Sig Dispense Auth. Provider   sulfamethoxazole-trimethoprim (BACTRIM DS) 800-160 MG tablet Take 1 tablet by mouth 2 (two) times daily for 3 days. 6 tablet 09/05/2021, FNP      PDMP not reviewed this encounter.   Trevor Iha, FNP 09/18/21 1336

## 2021-09-19 ENCOUNTER — Telehealth: Payer: Self-pay

## 2021-09-19 NOTE — Telephone Encounter (Signed)
Pt calls to report increased bleeding from vagina, same amount as normal menstrual cycle although it is the middle of her monthly cycle. Advised M. Ave Filter, FNP. Advised  per M. Ragan, FNP to continue taking prescribed Bactrim and follow up with OB-GYN and/or ED if bleeding becomes excessive. Pt verbalizes understanding.

## 2021-10-17 ENCOUNTER — Emergency Department (HOSPITAL_BASED_OUTPATIENT_CLINIC_OR_DEPARTMENT_OTHER): Payer: No Typology Code available for payment source

## 2021-10-17 ENCOUNTER — Other Ambulatory Visit: Payer: Self-pay

## 2021-10-17 ENCOUNTER — Emergency Department (HOSPITAL_BASED_OUTPATIENT_CLINIC_OR_DEPARTMENT_OTHER)
Admission: EM | Admit: 2021-10-17 | Discharge: 2021-10-17 | Disposition: A | Discharge status: Home / Self Care | Payer: No Typology Code available for payment source | Attending: Emergency Medicine | Admitting: Emergency Medicine

## 2021-10-17 ENCOUNTER — Encounter (HOSPITAL_BASED_OUTPATIENT_CLINIC_OR_DEPARTMENT_OTHER): Payer: Self-pay | Admitting: Emergency Medicine

## 2021-10-17 DIAGNOSIS — R109 Unspecified abdominal pain: Secondary | ICD-10-CM

## 2021-10-17 DIAGNOSIS — K861 Other chronic pancreatitis: Secondary | ICD-10-CM | POA: Diagnosis not present

## 2021-10-17 DIAGNOSIS — R1013 Epigastric pain: Secondary | ICD-10-CM | POA: Diagnosis present

## 2021-10-17 LAB — CBC WITH DIFFERENTIAL/PLATELET
Abs Immature Granulocytes: 0.02 10*3/uL (ref 0.00–0.07)
Basophils Absolute: 0 10*3/uL (ref 0.0–0.1)
Basophils Relative: 0 %
Eosinophils Absolute: 0.1 10*3/uL (ref 0.0–0.5)
Eosinophils Relative: 2 %
HCT: 37 % (ref 36.0–46.0)
Hemoglobin: 12 g/dL (ref 12.0–15.0)
Immature Granulocytes: 0 %
Lymphocytes Relative: 51 %
Lymphs Abs: 2.9 10*3/uL (ref 0.7–4.0)
MCH: 28.9 pg (ref 26.0–34.0)
MCHC: 32.4 g/dL (ref 30.0–36.0)
MCV: 89.2 fL (ref 80.0–100.0)
Monocytes Absolute: 0.4 10*3/uL (ref 0.1–1.0)
Monocytes Relative: 7 %
Neutro Abs: 2.3 10*3/uL (ref 1.7–7.7)
Neutrophils Relative %: 40 %
Platelets: 260 10*3/uL (ref 150–400)
RBC: 4.15 MIL/uL (ref 3.87–5.11)
RDW: 12.7 % (ref 11.5–15.5)
WBC: 5.7 10*3/uL (ref 4.0–10.5)
nRBC: 0 % (ref 0.0–0.2)

## 2021-10-17 LAB — COMPREHENSIVE METABOLIC PANEL
ALT: 11 U/L (ref 0–44)
AST: 15 U/L (ref 15–41)
Albumin: 3.6 g/dL (ref 3.5–5.0)
Alkaline Phosphatase: 54 U/L (ref 38–126)
Anion gap: 4 — ABNORMAL LOW (ref 5–15)
BUN: 9 mg/dL (ref 6–20)
CO2: 26 mmol/L (ref 22–32)
Calcium: 8.8 mg/dL — ABNORMAL LOW (ref 8.9–10.3)
Chloride: 106 mmol/L (ref 98–111)
Creatinine, Ser: 0.89 mg/dL (ref 0.44–1.00)
GFR, Estimated: >60 mL/min (ref >60)
Glucose, Bld: 96 mg/dL (ref 70–99)
Potassium: 4.5 mmol/L (ref 3.5–5.1)
Sodium: 136 mmol/L (ref 135–145)
Total Bilirubin: 0.2 mg/dL — ABNORMAL LOW (ref 0.3–1.2)
Total Protein: 7.7 g/dL (ref 6.5–8.1)

## 2021-10-17 LAB — URINALYSIS, ROUTINE W REFLEX MICROSCOPIC
Bilirubin Urine: NEGATIVE
Glucose, UA: NEGATIVE mg/dL
Hgb urine dipstick: NEGATIVE
Ketones, ur: NEGATIVE mg/dL
Leukocytes,Ua: NEGATIVE
Nitrite: NEGATIVE
Protein, ur: NEGATIVE mg/dL
Specific Gravity, Urine: 1.02 (ref 1.005–1.030)
pH: 7.5 (ref 5.0–8.0)

## 2021-10-17 LAB — HCG, SERUM, QUALITATIVE: Preg, Serum: NEGATIVE

## 2021-10-17 LAB — PREGNANCY, URINE: Preg Test, Ur: NEGATIVE

## 2021-10-17 LAB — ETHANOL: Alcohol, Ethyl (B): <10 mg/dL (ref <10)

## 2021-10-17 LAB — LIPASE, BLOOD: Lipase: 32 U/L (ref 11–51)

## 2021-10-17 MED ORDER — HYDROMORPHONE HCL 1 MG/ML IJ SOLN
0.5000 mg | Freq: Once | INTRAMUSCULAR | Status: AC
  Administered 2021-10-17: 0.5 mg via INTRAVENOUS
  Filled 2021-10-17: qty 1

## 2021-10-17 MED ORDER — IOHEXOL 300 MG/ML  SOLN
100.0000 mL | Freq: Once | INTRAMUSCULAR | Status: AC | PRN
  Administered 2021-10-17: 100 mL via INTRAVENOUS

## 2021-10-17 MED ORDER — FAMOTIDINE IN NACL 20-0.9 MG/50ML-% IV SOLN
20.0000 mg | Freq: Once | INTRAVENOUS | Status: AC
  Administered 2021-10-17: 20 mg via INTRAVENOUS
  Filled 2021-10-17: qty 50

## 2021-10-17 MED ORDER — LIDOCAINE VISCOUS HCL 2 % MT SOLN
15.0000 mL | Freq: Once | OROMUCOSAL | Status: AC
  Administered 2021-10-17: 15 mL via ORAL
  Filled 2021-10-17: qty 15

## 2021-10-17 MED ORDER — ALUM & MAG HYDROXIDE-SIMETH 200-200-20 MG/5ML PO SUSP
30.0000 mL | Freq: Once | ORAL | Status: AC
  Administered 2021-10-17: 30 mL via ORAL
  Filled 2021-10-17: qty 30

## 2021-10-17 MED ORDER — SUCRALFATE 1 G PO TABS: 1.0000 g | ORAL_TABLET | Freq: Three times a day (TID) | ORAL | 0 refills | Status: DC

## 2021-10-17 MED ORDER — KETOROLAC TROMETHAMINE 15 MG/ML IJ SOLN
15.0000 mg | Freq: Once | INTRAMUSCULAR | Status: AC
  Administered 2021-10-17: 15 mg via INTRAVENOUS
  Filled 2021-10-17: qty 1

## 2021-10-17 MED ORDER — ONDANSETRON HCL 4 MG/2ML IJ SOLN
4.0000 mg | Freq: Once | INTRAMUSCULAR | Status: AC
  Administered 2021-10-17: 4 mg via INTRAVENOUS
  Filled 2021-10-17: qty 2

## 2021-10-17 MED ORDER — HYDROCODONE-ACETAMINOPHEN 5-325 MG PO TABS: 1.0000 | ORAL_TABLET | Freq: Four times a day (QID) | ORAL | 0 refills | Status: DC | PRN

## 2021-10-17 NOTE — ED Notes (Signed)
Patient returned to room from CT. 

## 2021-10-17 NOTE — ED Notes (Signed)
Ginger Ale was given to the pt for PO challenge.

## 2021-10-17 NOTE — Discharge Instructions (Addendum)
It was a pleasure caring for you today in the emergency department.  Please return to the emergency department for any worsening or worrisome symptoms.  Please follow-up with your gastroenterologist at [facility]

## 2021-10-17 NOTE — ED Provider Notes (Signed)
[facility] EMERGENCY DEPARTMENT Provider Note   CSN: [id number] Arrival date & time: 10/17/21  1859     History  Chief Complaint  Patient presents with   Emesis    Shannon Holloway is a 26 y.o. female.  Patient as above with significant medical history as below, including chronic pancreatitis, follows at [facility] gastroenterology, anxiety, depression, ADHD, cholecystectomy who presents to the ED with complaint of right upper quadrant, epigastric pain.  Patient reports pain feels similar to her prior episodes of pancreatitis.  She reports she was recently diagnosed with COVID-19 approximately 3 days ago.  This is also the time when her pain began.  Feels similar to her prior episodes of pancreatitis.  She was started on Zofran by televisit.  She reports the Zofran has helped her nausea, last episode emesis was yesterday.  Last p.o. intake was earlier today.  Still having some ongoing pain.  No change in bowel or bladder function.  No fevers.  No trauma.  No rashes.  No chest pain or dyspnea.  She has appointment with gastroenterology next week [facility].    Past Medical History:  Diagnosis Date   ADHD (attention deficit hyperactivity disorder)    Anxiety    Depression    Pancreatitis 2012    Past Surgical History:  Procedure Laterality Date   CHOLECYSTECTOMY  2012   HERNIA REPAIR  2001     The history is provided by the patient. No language interpreter was used.  Emesis Associated symptoms: abdominal pain   Associated symptoms: no cough, no fever and no headaches        Home Medications Prior to Admission medications   Medication Sig Start Date End Date Taking? Authorizing Provider  HYDROcodone-acetaminophen (NORCO) 5-325 MG tablet Take 1 tablet by mouth every 6 (six) hours as needed for moderate pain. 10/17/21  Yes Tanda Rockers A, DO  sucralfate (CARAFATE) 1 g tablet Take 1 tablet (1 g total) by mouth 4 (four) times daily -  with meals and at bedtime for 7 days.  10/17/21 10/24/21 Yes Sloan Leiter, DO  norelgestromin-ethinyl estradiol [employer]) 150-35 MCG/24HR transdermal patch Place 1 patch onto the skin once a week.    [provider]  buPROPion (WELLBUTRIN XL) 150 MG 24 hr tablet Take 1 tablet (150 mg total) by mouth daily. Patient not taking: Reported on 12/21/2018 02/24/17 12/21/18  Thresa Ross, MD  VYVANSE 20 MG capsule Take 1 capsule (20 mg total) by mouth every morning. Patient not taking: Reported on 11/24/2018 11/23/16 12/21/18  Thresa Ross, MD      Allergies    Patient has no known allergies.    Review of Systems   Review of Systems  Constitutional:  Negative for activity change and fever.  HENT:  Negative for facial swelling and trouble swallowing.   Eyes:  Negative for discharge and redness.  Respiratory:  Negative for cough and shortness of breath.   Cardiovascular:  Negative for chest pain and palpitations.  Gastrointestinal:  Positive for abdominal pain and vomiting. Negative for nausea.  Genitourinary:  Negative for dysuria and flank pain.  Musculoskeletal:  Negative for back pain and gait problem.  Skin:  Negative for pallor and rash.  Neurological:  Negative for syncope and headaches.    Physical Exam Updated Vital Signs BP 114/69   Pulse 62   Temp 99 F (37.2 C) (Oral)   Resp 18   Ht 5\' 7"  (1.702 m)   Wt 83 kg   LMP 09/18/2021  SpO2 99%   BMI 28.66 kg/m  Physical Exam  ED Results / Procedures / Treatments   Labs (all labs ordered are listed, but only abnormal results are displayed) Labs Reviewed  COMPREHENSIVE METABOLIC PANEL - Abnormal; Notable for the following components:      Result Value   Calcium 8.8 (*)    Total Bilirubin 0.2 (*)    Anion gap 4 (*)    All other components within normal limits  CBC WITH DIFFERENTIAL/PLATELET  LIPASE, BLOOD  URINALYSIS, ROUTINE W REFLEX MICROSCOPIC  PREGNANCY, URINE  HCG, SERUM, QUALITATIVE  ETHANOL    EKG None  Radiology CT ABDOMEN PELVIS W  CONTRAST  Result Date: 10/17/2021 CLINICAL DATA:  Epigastric pain and history of chronic pancreatitis EXAM: CT ABDOMEN AND PELVIS WITH CONTRAST TECHNIQUE: Multidetector CT imaging of the abdomen and pelvis was performed using the standard protocol following bolus administration of intravenous contrast. RADIATION DOSE REDUCTION: This exam was performed according to the departmental dose-optimization program which includes automated exposure control, adjustment of the mA and/or kV according to patient size and/or use of iterative reconstruction technique. CONTRAST:  OMNIPAQUE IOHEXOL 300 MG/ML  SOLN COMPARISON:  09/15/2021 exam. FINDINGS: Lower chest: No acute abnormality. Hepatobiliary: No focal liver abnormality is seen. Status post cholecystectomy. No biliary dilatation. Pancreas: Head and uncinate process of the pancreas are again identified and prominent in size stable from the prior exam. Pancreatic stent remains in place centrally. The more peripheral pancreas appears somewhat atrophic. The pancreatic cyst in the head is not as well appreciated on today's exam although present. Spleen: Normal in size without focal abnormality. Adrenals/Urinary Tract: Adrenal glands are within normal limits. Kidneys demonstrate a normal enhancement pattern bilaterally. No renal calculi or obstructive changes are seen. The bladder is partially distended. Stomach/Bowel: The appendix is well visualized and within normal limits. Stomach and small bowel are within normal limits. No obstructive or inflammatory changes of the colon are seen. Vascular/Lymphatic: No significant vascular findings are present. No enlarged abdominal or pelvic lymph nodes. Reproductive: Uterus and bilateral adnexa are unremarkable. Other: No abdominal wall hernia or abnormality. No abdominopelvic ascites. Musculoskeletal: No acute or significant osseous findings. IMPRESSION: Stable enlargement of the head and uncinate process of the pancreas.  Pancreatic stent is noted centrally and stable in appearance. No acute pancreatitis is noted at this time. Known peripancreatic cyst adjacent to the head of the pancreas is not as well appreciated on today's exam. No acute abnormality is noted. Electronically Signed   By: Alcide Clever M.D.   On: 10/17/2021 21:42    Procedures Procedures    Medications Ordered in ED Medications  alum & mag hydroxide-simeth (MAALOX/MYLANTA) 200-200-20 MG/5ML suspension 30 mL (30 mLs Oral Given 10/17/21 2010)    And  lidocaine (XYLOCAINE) 2 % viscous mouth solution 15 mL (15 mLs Oral Given 10/17/21 2010)  ketorolac (TORADOL) 15 MG/ML injection 15 mg (15 mg Intravenous Given 10/17/21 2033)  famotidine (PEPCID) IVPB 20 mg premix (0 mg Intravenous Stopped 10/17/21 2144)  ondansetron (ZOFRAN) injection 4 mg (4 mg Intravenous Given 10/17/21 2033)  HYDROmorphone (DILAUDID) injection 0.5 mg (0.5 mg Intravenous Given 10/17/21 2034)  iohexol (OMNIPAQUE) 300 MG/ML solution 100 mL (100 mLs Intravenous Contrast Given 10/17/21 2127)    ED Course/ Medical Decision Making/ A&P                           Medical Decision Making Amount and/or Complexity of Data Reviewed Labs:  ordered. Radiology: ordered.  Risk OTC drugs. Prescription drug management.    CC: abd pain  This patient presents to the Emergency Department for the above complaint. This involves an extensive number of treatment options and is a complaint that carries with it a high risk of complications and morbidity. Vital signs were reviewed. Serious etiologies considered.  Differential diagnosis includes but is not exclusive to acute cholecystitis, intrathoracic causes for epigastric abdominal pain, gastritis, duodenitis, pancreatitis, small bowel or large bowel obstruction, abdominal aortic aneurysm, hernia, gastritis, etc.  She has no chest pain.  No dyspnea.  No palpitations.  HDS.  Record review:  Previous records obtained and reviewed prior ED visits,  prior labs and imaging, PDMP  Additional history obtained from N/A  Medical and surgical history as noted above.   Work up as above, notable for:  Labs & imaging results that were available during my care of the patient were visualized by me and considered in my medical decision making.  Physical exam as above.   I ordered imaging studies which included CT AP. I visualized the imaging, interpreted images, and I agree with radiologist interpretation.  No acute abnormalities, there were  chronic changes noted  Cardiac monitoring reviewed and interpreted personally which shows NSR  Labs reviewed and are stable  Management: GI cocktail, analgesics  ED Course:     Reassessment:  Symptoms improved  Admission was considered.   She is tolerating p.o. intake without difficulty.  No longer feeling nauseated.    Symptoms today likely secondary to her chronic pancreatitis.  No evidence of acute pancreatitis on imaging or work-up today.  She follows closely with gastroenterology, she is scheduled for ERCP next month.  Recommended bland diet, avoid alcohol or tobacco products.  Avoid THC usage.  Follow-up with GI and PCP.  The patient improved significantly and was discharged in stable condition. Detailed discussions were had with the patient regarding current findings, and need for close f/u with PCP or on call doctor. The patient has been instructed to return immediately if the symptoms worsen in any way for re-evaluation. Patient verbalized understanding and is in agreement with current care plan. All questions answered prior to discharge.           Social determinants of health include -  THC usage Social History   Socioeconomic History   Marital status: Single    Spouse name: Not on file   Number of children: Not on file   Years of education: Not on file   Highest education level: Not on file  Occupational History   Not on file  Tobacco Use   Smoking status: Never    Smokeless tobacco: Never  Vaping Use   Vaping Use: Never used  Substance and Sexual Activity   Alcohol use: Yes    Comment: social   Drug use: Yes    Types: Marijuana   Sexual activity: Yes    Partners: Male    Birth control/protection: Patch  Other Topics Concern   Not on file  Social History Narrative   Not on file   Social Determinants of Health   Financial Resource Strain: Not on file  Food Insecurity: Not on file  Transportation Needs: Not on file  Physical Activity: Not on file  Stress: Not on file  Social Connections: Not on file  Intimate Partner Violence: Not on file      This chart was dictated using voice recognition software.  Despite best efforts to proofread,  errors can  occur which can change the documentation meaning.         Final Clinical Impression(s) / ED Diagnoses Final diagnoses:  Abdominal pain, unspecified abdominal location  Chronic pancreatitis, unspecified pancreatitis type (HCC)    Rx / DC Orders ED Discharge Orders          Ordered    sucralfate (CARAFATE) 1 g tablet  3 times daily with meals & bedtime        10/17/21 2245    HYDROcodone-acetaminophen (NORCO) 5-325 MG tablet  Every 6 hours PRN        10/17/21 2245              Sloan Leiter, DO 10/17/21 2251

## 2021-10-17 NOTE — ED Triage Notes (Signed)
Pt has chronic pancreatitis, having bad flair up since wednesday after + COVID, unable to keep anything down. Seen by video visit was given Zofran, it was working at first but not now.

## 2022-02-03 ENCOUNTER — Encounter (HOSPITAL_BASED_OUTPATIENT_CLINIC_OR_DEPARTMENT_OTHER): Payer: Self-pay | Admitting: Emergency Medicine

## 2022-02-03 ENCOUNTER — Emergency Department (HOSPITAL_BASED_OUTPATIENT_CLINIC_OR_DEPARTMENT_OTHER)
Admission: EM | Admit: 2022-02-03 | Discharge: 2022-02-04 | Disposition: A | Discharge status: Home / Self Care | Payer: No Typology Code available for payment source | Attending: Emergency Medicine | Admitting: Emergency Medicine

## 2022-02-03 DIAGNOSIS — R748 Abnormal levels of other serum enzymes: Secondary | ICD-10-CM | POA: Diagnosis not present

## 2022-02-03 DIAGNOSIS — R112 Nausea with vomiting, unspecified: Secondary | ICD-10-CM | POA: Insufficient documentation

## 2022-02-03 DIAGNOSIS — K861 Other chronic pancreatitis: Secondary | ICD-10-CM

## 2022-02-03 DIAGNOSIS — R1013 Epigastric pain: Secondary | ICD-10-CM | POA: Insufficient documentation

## 2022-02-03 LAB — CBC WITH DIFFERENTIAL/PLATELET
Abs Immature Granulocytes: 0.02 10*3/uL (ref 0.00–0.07)
Basophils Absolute: 0 10*3/uL (ref 0.0–0.1)
Basophils Relative: 0 %
Eosinophils Absolute: 0.1 10*3/uL (ref 0.0–0.5)
Eosinophils Relative: 1 %
HCT: 39.8 % (ref 36.0–46.0)
Hemoglobin: 12.7 g/dL (ref 12.0–15.0)
Immature Granulocytes: 0 %
Lymphocytes Relative: 34 %
Lymphs Abs: 2.7 10*3/uL (ref 0.7–4.0)
MCH: 28.7 pg (ref 26.0–34.0)
MCHC: 31.9 g/dL (ref 30.0–36.0)
MCV: 90 fL (ref 80.0–100.0)
Monocytes Absolute: 0.4 10*3/uL (ref 0.1–1.0)
Monocytes Relative: 5 %
Neutro Abs: 4.8 10*3/uL (ref 1.7–7.7)
Neutrophils Relative %: 60 %
Platelets: 271 10*3/uL (ref 150–400)
RBC: 4.42 MIL/uL (ref 3.87–5.11)
RDW: 12.2 % (ref 11.5–15.5)
WBC: 8 10*3/uL (ref 4.0–10.5)
nRBC: 0 % (ref 0.0–0.2)

## 2022-02-03 LAB — LIPASE, BLOOD: Lipase: 62 U/L — ABNORMAL HIGH (ref 11–51)

## 2022-02-03 LAB — COMPREHENSIVE METABOLIC PANEL
ALT: 15 U/L (ref 0–44)
AST: 19 U/L (ref 15–41)
Albumin: 4.3 g/dL (ref 3.5–5.0)
Alkaline Phosphatase: 57 U/L (ref 38–126)
Anion gap: 6 (ref 5–15)
BUN: 9 mg/dL (ref 6–20)
CO2: 25 mmol/L (ref 22–32)
Calcium: 9.1 mg/dL (ref 8.9–10.3)
Chloride: 106 mmol/L (ref 98–111)
Creatinine, Ser: 0.92 mg/dL (ref 0.44–1.00)
GFR, Estimated: >60 mL/min (ref >60)
Glucose, Bld: 88 mg/dL (ref 70–99)
Potassium: 3.6 mmol/L (ref 3.5–5.1)
Sodium: 137 mmol/L (ref 135–145)
Total Bilirubin: 0.5 mg/dL (ref 0.3–1.2)
Total Protein: 8.5 g/dL — ABNORMAL HIGH (ref 6.5–8.1)

## 2022-02-03 LAB — URINALYSIS, ROUTINE W REFLEX MICROSCOPIC
Bilirubin Urine: NEGATIVE
Glucose, UA: NEGATIVE mg/dL
Hgb urine dipstick: NEGATIVE
Ketones, ur: NEGATIVE mg/dL
Leukocytes,Ua: NEGATIVE
Nitrite: NEGATIVE
Protein, ur: NEGATIVE mg/dL
Specific Gravity, Urine: 1.015 (ref 1.005–1.030)
pH: 6.5 (ref 5.0–8.0)

## 2022-02-03 LAB — PREGNANCY, URINE: Preg Test, Ur: NEGATIVE

## 2022-02-03 NOTE — ED Triage Notes (Signed)
Reports hx of chronic pancreatitis. Concerned for flare up due to n/v and abdominal pain that started today.  Reports having pancreatic duct stents removed in September.

## 2022-02-04 MED ORDER — HYDROCODONE-ACETAMINOPHEN 5-325 MG PO TABS
1.0000 | ORAL_TABLET | Freq: Four times a day (QID) | ORAL | 0 refills | Status: DC | PRN
Start: 2022-02-04 — End: 2022-09-29

## 2022-02-04 MED ORDER — SODIUM CHLORIDE 0.9 % IV BOLUS
1000.0000 mL | Freq: Once | INTRAVENOUS | Status: AC
Start: 2022-02-04 — End: 2022-02-04
  Administered 2022-02-04: 1000 mL via INTRAVENOUS

## 2022-02-04 MED ORDER — MORPHINE SULFATE (PF) 4 MG/ML IV SOLN
4.0000 mg | Freq: Once | INTRAVENOUS | Status: AC
  Administered 2022-02-04: 4 mg via INTRAVENOUS
  Filled 2022-02-04: qty 1

## 2022-02-04 MED ORDER — ONDANSETRON 4 MG PO TBDP
8.0000 mg | ORAL_TABLET | Freq: Once | ORAL | Status: AC
Start: 2022-02-04 — End: 2022-02-04
  Administered 2022-02-04: 8 mg via ORAL
  Filled 2022-02-04: qty 2

## 2022-02-04 NOTE — ED Provider Notes (Signed)
[facility] EMERGENCY DEPARTMENT Provider Note   CSN: [id number] Arrival date & time: 02/03/22  2258     History  Chief Complaint  Patient presents with   Abdominal Pain   Emesis    Shannon Holloway is a 26 y.o. female.  Patient is a 26 year old female with past medical history of pancreas divisum with history of multiple pancreatic stents performed at [facility].  She is followed by a GI doctor there.  She presents today with complaints of epigastric pain.  This started earlier today and is worsening.  She describes constant pain to the epigastrium with nausea and vomiting.  She denies any fevers or chills.  She denies any bloody stools or vomit.  She has tried ibuprofen with little relief.  The history is provided by the patient.       Home Medications Prior to Admission medications   Medication Sig Start Date End Date Taking? Authorizing Provider  HYDROcodone-acetaminophen (NORCO) 5-325 MG tablet Take 1 tablet by mouth every 6 (six) hours as needed for moderate pain. 10/17/21   Sloan Leiter, DO  norelgestromin-ethinyl estradiol [employer]) 150-35 MCG/24HR transdermal patch Place 1 patch onto the skin once a week.    [provider]  sucralfate (CARAFATE) 1 g tablet Take 1 tablet (1 g total) by mouth 4 (four) times daily -  with meals and at bedtime for 7 days. 10/17/21 10/24/21  Sloan Leiter, DO  buPROPion (WELLBUTRIN XL) 150 MG 24 hr tablet Take 1 tablet (150 mg total) by mouth daily. Patient not taking: Reported on 12/21/2018 02/24/17 12/21/18  Thresa Ross, MD  VYVANSE 20 MG capsule Take 1 capsule (20 mg total) by mouth every morning. Patient not taking: Reported on 11/24/2018 11/23/16 12/21/18  Thresa Ross, MD      Allergies    Patient has no known allergies.    Review of Systems   Review of Systems  All other systems reviewed and are negative.   Physical Exam Updated Vital Signs BP (!) 140/98 (BP Location: Left Arm)   Pulse 77   Temp 97.7 F (36.5  C)   Resp 20   LMP 01/23/2022 (Exact Date)   SpO2 97%  Physical Exam Vitals and nursing note reviewed.  Constitutional:      General: She is not in acute distress.    Appearance: She is well-developed. She is not diaphoretic.  HENT:     Head: Normocephalic and atraumatic.  Cardiovascular:     Rate and Rhythm: Normal rate and regular rhythm.     Heart sounds: No murmur heard.    No friction rub. No gallop.  Pulmonary:     Effort: Pulmonary effort is normal. No respiratory distress.     Breath sounds: Normal breath sounds. No wheezing.  Abdominal:     General: Bowel sounds are normal. There is no distension.     Palpations: Abdomen is soft.     Tenderness: There is abdominal tenderness in the epigastric area. There is no right CVA tenderness, left CVA tenderness or guarding.  Musculoskeletal:        General: Normal range of motion.     Cervical back: Normal range of motion and neck supple.  Skin:    General: Skin is warm and dry.  Neurological:     General: No focal deficit present.     Mental Status: She is alert and oriented to person, place, and time.     ED Results / Procedures / Treatments   Labs (  all labs ordered are listed, but only abnormal results are displayed) Labs Reviewed  LIPASE, BLOOD - Abnormal; Notable for the following components:      Result Value   Lipase 62 (*)    All other components within normal limits  COMPREHENSIVE METABOLIC PANEL - Abnormal; Notable for the following components:   Total Protein 8.5 (*)    All other components within normal limits  URINALYSIS, ROUTINE W REFLEX MICROSCOPIC  PREGNANCY, URINE  CBC WITH DIFFERENTIAL/PLATELET    EKG None  Radiology No results found.  Procedures Procedures    Medications Ordered in ED Medications  sodium chloride 0.9 % bolus 1,000 mL (has no administration in time range)  morphine (PF) 4 MG/ML injection 4 mg (has no administration in time range)  ondansetron (ZOFRAN-ODT) disintegrating  tablet 8 mg (has no administration in time range)    ED Course/ Medical Decision Making/ A&P  Patient with history of pancreas device and chronic pancreatitis as described in the HPI.  Patient presenting today with epigastric pain.  This has been worsening since earlier today.  Episode feels similar to prior episodes of pancreatitis.  Patient arrives here with stable vital signs and is afebrile.  There is tenderness to palpation in the epigastric region with no peritoneal signs.  Work-up initiated including CBC, CMP, and lipase.  Laboratory studies unremarkable with the exception of lipase slightly elevated at 62.  Patient hydrated with normal saline and given medication for pain and nausea.  She seems to be feeling better.  At this point, I feel as though she can safely be discharged.  I will prescribe pain medication and have her follow-up with her GI doctors at [facility].  Clear liquid diet recommended.  Final Clinical Impression(s) / ED Diagnoses Final diagnoses:  None    Rx / DC Orders ED Discharge Orders     None         Geoffery Lyons, MD 02/04/22 [phone]

## 2022-02-04 NOTE — Discharge Instructions (Signed)
Take hydrocodone as prescribed as needed for pain.  Clear liquid diet for the next 24 hours, then slowly reintroduce solid foods as tolerated.  Follow-up with your gastroenterologist in the next few days.

## 2022-08-06 ENCOUNTER — Emergency Department (HOSPITAL_BASED_OUTPATIENT_CLINIC_OR_DEPARTMENT_OTHER)
Admission: EM | Admit: 2022-08-06 | Discharge: 2022-08-06 | Disposition: A | Discharge status: Home / Self Care | Payer: Self-pay | Attending: Emergency Medicine | Admitting: Emergency Medicine

## 2022-08-06 ENCOUNTER — Other Ambulatory Visit: Payer: Self-pay

## 2022-08-06 ENCOUNTER — Emergency Department (HOSPITAL_BASED_OUTPATIENT_CLINIC_OR_DEPARTMENT_OTHER): Payer: Self-pay

## 2022-08-06 ENCOUNTER — Encounter (HOSPITAL_BASED_OUTPATIENT_CLINIC_OR_DEPARTMENT_OTHER): Payer: Self-pay

## 2022-08-06 DIAGNOSIS — S62665A Nondisplaced fracture of distal phalanx of left ring finger, initial encounter for closed fracture: Secondary | ICD-10-CM | POA: Insufficient documentation

## 2022-08-06 DIAGNOSIS — W231XXA Caught, crushed, jammed, or pinched between stationary objects, initial encounter: Secondary | ICD-10-CM | POA: Insufficient documentation

## 2022-08-06 DIAGNOSIS — S62664A Nondisplaced fracture of distal phalanx of right ring finger, initial encounter for closed fracture: Secondary | ICD-10-CM

## 2022-08-06 DIAGNOSIS — M79645 Pain in left finger(s): Secondary | ICD-10-CM | POA: Insufficient documentation

## 2022-08-06 NOTE — ED Provider Notes (Signed)
[place] [facility]ERGENCY DEPARTMENT AT [facility] Provider Note   CSN: [id number] Arrival date & time: 08/06/22  1226     History  Chief Complaint  Patient presents with   Finger Injury    Shannon Holloway is a 27 y.o. female.  Pt complains of pain in the end of her right 4th finger.  Pt reports her finger got caught in her dogs leash.  Pt complains of bruising and pain  The history is provided by the patient. No language interpreter was used.       Home Medications Prior to Admission medications   Medication Sig Start Date End Date Taking? Authorizing Provider  HYDROcodone-acetaminophen (NORCO) 5-325 MG tablet Take 1-2 tablets by mouth every 6 (six) hours as needed. 02/04/22   Geoffery Lyons, MD  norelgestromin-ethinyl estradiol [employer]) 150-35 MCG/24HR transdermal patch Place 1 patch onto the skin once a week.    [provider]  sucralfate (CARAFATE) 1 g tablet Take 1 tablet (1 g total) by mouth 4 (four) times daily -  with meals and at bedtime for 7 days. 10/17/21 10/24/21  Sloan Leiter, DO  buPROPion (WELLBUTRIN XL) 150 MG 24 hr tablet Take 1 tablet (150 mg total) by mouth daily. Patient not taking: Reported on 12/21/2018 02/24/17 12/21/18  Thresa Ross, MD  VYVANSE 20 MG capsule Take 1 capsule (20 mg total) by mouth every morning. Patient not taking: Reported on 11/24/2018 11/23/16 12/21/18  Thresa Ross, MD      Allergies    Patient has no known allergies.    Review of Systems   Review of Systems  All other systems reviewed and are negative.   Physical Exam Updated Vital Signs BP 134/80   Pulse 99   Temp 98.9 F (37.2 C) (Oral)   Resp 17   Ht 5\' 7"  (1.702 m)   Wt 83 kg   SpO2 100%   BMI 28.66 kg/m  Physical Exam Vitals reviewed.  HENT:     Head: Normocephalic.  Cardiovascular:     Rate and Rhythm: Normal rate.  Musculoskeletal:        General: Swelling and tenderness present.     Comments: Swollen tender distal 4th finger,   decreased range of motion  nv and ns intact   Skin:    General: Skin is warm.  Neurological:     General: No focal deficit present.     Mental Status: She is alert.  Psychiatric:        Mood and Affect: Mood normal.     ED Results / Procedures / Treatments   Labs (all labs ordered are listed, but only abnormal results are displayed) Labs Reviewed - No data to display  EKG None  Radiology DG Finger Ring Left  Result Date: 08/06/2022 CLINICAL DATA:  injury EXAM: LEFT RING FINGER 2+V COMPARISON:  None Available. FINDINGS: There is an age-indeterminate small osseous fragment at the dorsal base of the distal fourth phalanx. Joint spaces and alignment are maintained. No area of erosion or osseous destruction. No unexpected radiopaque foreign body. Soft tissues are unremarkable. IMPRESSION: Small osseous fragment at the dorsal base of the fourth distal phalanx consistent with age-indeterminate minimally displaced fracture. Recommend correlation with point tenderness. Electronically Signed   By: Meda Klinefelter M.D.   On: 08/06/2022 13:08    Procedures Procedures    Medications Ordered in ED Medications - No data to display  ED Course/ Medical Decision Making/ A&P  Medical Decision Making Pt complains of pain in her distal right 4th finger.   Amount and/or Complexity of Data Reviewed Radiology: ordered and independent interpretation performed. Decision-making details documented in ED Course.    Details: Small fragment dorsal phalanx     Risk OTC drugs. Risk Details: Pt placed in a splint             Final Clinical Impression(s) / ED Diagnoses Final diagnoses:  Closed nondisplaced fracture of distal phalanx of right ring finger, initial encounter    Rx / DC Orders ED Discharge Orders     None      An After Visit Summary was printed and given to the patient.    Elson Areas, [place] 08/06/22 1503    Pricilla Loveless,  MD 08/07/22 [phone]

## 2022-08-06 NOTE — ED Triage Notes (Signed)
Patient stated her left ring finger out caught in her dogs harness and is now hurting.

## 2022-08-06 NOTE — Discharge Instructions (Signed)
Return if any problems.

## 2022-09-08 ENCOUNTER — Encounter (HOSPITAL_BASED_OUTPATIENT_CLINIC_OR_DEPARTMENT_OTHER): Payer: Self-pay

## 2022-09-08 ENCOUNTER — Emergency Department (HOSPITAL_BASED_OUTPATIENT_CLINIC_OR_DEPARTMENT_OTHER)
Admission: EM | Admit: 2022-09-08 | Discharge: 2022-09-09 | Disposition: A | Discharge status: Home / Self Care | Payer: Self-pay | Attending: Emergency Medicine | Admitting: Emergency Medicine

## 2022-09-08 ENCOUNTER — Other Ambulatory Visit: Payer: Self-pay

## 2022-09-08 DIAGNOSIS — K861 Other chronic pancreatitis: Secondary | ICD-10-CM | POA: Insufficient documentation

## 2022-09-08 LAB — CBC
HCT: 37.4 % (ref 36.0–46.0)
Hemoglobin: 12.2 g/dL (ref 12.0–15.0)
MCH: 29.3 pg (ref 26.0–34.0)
MCHC: 32.6 g/dL (ref 30.0–36.0)
MCV: 89.9 fL (ref 80.0–100.0)
Platelets: 236 10*3/uL (ref 150–400)
RBC: 4.16 MIL/uL (ref 3.87–5.11)
RDW: 12.5 % (ref 11.5–15.5)
WBC: 7.7 10*3/uL (ref 4.0–10.5)
nRBC: 0 % (ref 0.0–0.2)

## 2022-09-08 LAB — COMPREHENSIVE METABOLIC PANEL
ALT: 17 U/L (ref 0–44)
AST: 17 U/L (ref 15–41)
Albumin: 4.4 g/dL (ref 3.5–5.0)
Alkaline Phosphatase: 57 U/L (ref 38–126)
Anion gap: 6 (ref 5–15)
BUN: 13 mg/dL (ref 6–20)
CO2: 23 mmol/L (ref 22–32)
Calcium: 8.9 mg/dL (ref 8.9–10.3)
Chloride: 105 mmol/L (ref 98–111)
Creatinine, Ser: 0.79 mg/dL (ref 0.44–1.00)
GFR, Estimated: >60 mL/min (ref >60)
Glucose, Bld: 93 mg/dL (ref 70–99)
Potassium: 3.9 mmol/L (ref 3.5–5.1)
Sodium: 134 mmol/L — ABNORMAL LOW (ref 135–145)
Total Bilirubin: 0.5 mg/dL (ref 0.3–1.2)
Total Protein: 8.1 g/dL (ref 6.5–8.1)

## 2022-09-08 LAB — URINALYSIS, ROUTINE W REFLEX MICROSCOPIC
Bilirubin Urine: NEGATIVE
Glucose, UA: NEGATIVE mg/dL
Hgb urine dipstick: NEGATIVE
Ketones, ur: NEGATIVE mg/dL
Leukocytes,Ua: NEGATIVE
Nitrite: NEGATIVE
Protein, ur: NEGATIVE mg/dL
Specific Gravity, Urine: 1.025 (ref 1.005–1.030)
pH: 6.5 (ref 5.0–8.0)

## 2022-09-08 LAB — LIPASE, BLOOD: Lipase: 47 U/L (ref 11–51)

## 2022-09-08 LAB — PREGNANCY, URINE: Preg Test, Ur: NEGATIVE

## 2022-09-08 MED ORDER — SODIUM CHLORIDE 0.9 % IV BOLUS
1000.0000 mL | Freq: Once | INTRAVENOUS | Status: AC
  Administered 2022-09-08: 1000 mL via INTRAVENOUS

## 2022-09-08 MED ORDER — ONDANSETRON 4 MG PO TBDP: 4.0000 mg | ORAL_TABLET | Freq: Three times a day (TID) | ORAL | 0 refills | Status: AC | PRN

## 2022-09-08 MED ORDER — ONDANSETRON HCL 4 MG/2ML IJ SOLN
4.0000 mg | Freq: Once | INTRAMUSCULAR | Status: AC
  Administered 2022-09-08: 4 mg via INTRAVENOUS
  Filled 2022-09-08: qty 2

## 2022-09-08 MED ORDER — HYDROMORPHONE HCL 1 MG/ML IJ SOLN
0.5000 mg | Freq: Once | INTRAMUSCULAR | Status: AC
  Administered 2022-09-08: 0.5 mg via INTRAVENOUS
  Filled 2022-09-08: qty 1

## 2022-09-08 MED ORDER — OXYCODONE-ACETAMINOPHEN 5-325 MG PO TABS: 1.0000 | ORAL_TABLET | Freq: Four times a day (QID) | ORAL | 0 refills | Status: DC | PRN

## 2022-09-08 MED ORDER — MORPHINE SULFATE (PF) 4 MG/ML IV SOLN
4.0000 mg | Freq: Once | INTRAVENOUS | Status: AC
  Administered 2022-09-08: 4 mg via INTRAVENOUS
  Filled 2022-09-08: qty 1

## 2022-09-08 NOTE — Discharge Instructions (Addendum)
Please follow-up with your primary care doctor, return to the ER if you have worsening abdominal pain.  Make sure you are drinking just lots of fluids, and hold on eating for now.

## 2022-09-08 NOTE — ED Provider Notes (Signed)
[place] [facility] Provider Note   CSN: [id number] Arrival date & time: 09/08/22  2108     History  Chief Complaint  Patient presents with   Abdominal Pain    Shannon Holloway is a 27 y.o. female, history of pancreas divisum w/multiple pancreatic stents at [facility], who presents to the ED secondary to epigastric pain that started earlier today, with associated nausea and vomiting.  She states she just feels generally unwell, and she feels like she is having a pancreatic flare.  Denies any diarrhea, fever, chills.  No chest pain or shortness of breath.  Notes she cannot keep anything down.  Denies any blood in the vomit.  Feels very similar to her past pancreatic episodes.   Home Medications Prior to Admission medications   Medication Sig Start Date End Date Taking? Authorizing Provider  ondansetron (ZOFRAN-ODT) 4 MG disintegrating tablet Take 1 tablet (4 mg total) by mouth every 8 (eight) hours as needed for nausea or vomiting. 09/08/22  Yes Ramsay Bognar L, PA  oxyCODONE-acetaminophen (PERCOCET/ROXICET) 5-325 MG tablet Take 1 tablet by mouth every 6 (six) hours as needed for severe pain. 09/08/22  Yes Cross Jorge L, PA  HYDROcodone-acetaminophen (NORCO) 5-325 MG tablet Take 1-2 tablets by mouth every 6 (six) hours as needed. 02/04/22   Geoffery Lyons, MD  norelgestromin-ethinyl estradiol [employer] [facility]) 150-35 MCG/24HR transdermal patch Place 1 patch onto the skin once a week.    [provider]  sucralfate (CARAFATE) 1 g tablet Take 1 tablet (1 g total) by mouth 4 (four) times daily -  with meals and at bedtime for 7 days. 10/17/21 10/24/21  Sloan Leiter, DO  buPROPion (WELLBUTRIN XL) 150 MG 24 hr tablet Take 1 tablet (150 mg total) by mouth daily. Patient not taking: Reported on 12/21/2018 02/24/17 12/21/18  Thresa Ross, MD  VYVANSE 20 MG capsule Take 1 capsule (20 mg total) by mouth every morning. Patient not taking: Reported on 11/24/2018 11/23/16  12/21/18  Thresa Ross, MD      Allergies    Patient has no known allergies.    Review of Systems   Review of Systems  Constitutional:  Negative for fever.  Gastrointestinal:  Positive for abdominal pain, nausea and vomiting.    Physical Exam Updated Vital Signs BP (!) 141/95 (BP Location: Right Arm)   Pulse 98   Temp 99 F (37.2 C) (Oral)   Resp 18   Ht 5\' 7"  (1.702 m)   Wt 78 kg   SpO2 100%   BMI 26.94 kg/m  Physical Exam Vitals and nursing note reviewed.  Constitutional:      General: She is not in acute distress.    Appearance: She is well-developed.  HENT:     Head: Normocephalic and atraumatic.  Eyes:     Conjunctiva/sclera: Conjunctivae normal.  Cardiovascular:     Rate and Rhythm: Normal rate and regular rhythm.     Heart sounds: No murmur heard. Pulmonary:     Effort: Pulmonary effort is normal. No respiratory distress.     Breath sounds: Normal breath sounds.  Abdominal:     Palpations: Abdomen is soft.     Tenderness: There is abdominal tenderness in the epigastric area.  Musculoskeletal:        General: No swelling.     Cervical back: Neck supple.  Skin:    General: Skin is warm and dry.     Capillary Refill: Capillary refill takes less than 2 seconds.  Neurological:     Mental Status: She is alert.  Psychiatric:        Mood and Affect: Mood normal.     ED Results / Procedures / Treatments   Labs (all labs ordered are listed, but only abnormal results are displayed) Labs Reviewed  COMPREHENSIVE METABOLIC PANEL - Abnormal; Notable for the following components:      Result Value   Sodium 134 (*)    All other components within normal limits  LIPASE, BLOOD  CBC  URINALYSIS, ROUTINE W REFLEX MICROSCOPIC  PREGNANCY, URINE    EKG None  Radiology No results found.  Procedures Procedures    Medications Ordered in ED Medications  morphine (PF) 4 MG/ML injection 4 mg (4 mg Intravenous Given 09/08/22 2220)  ondansetron (ZOFRAN)  injection 4 mg (4 mg Intravenous Given 09/08/22 2218)  sodium chloride 0.9 % bolus 1,000 mL (1,000 mLs Intravenous New Bag/Given 09/08/22 2217)  ondansetron (ZOFRAN) injection 4 mg (4 mg Intravenous Given 09/08/22 2323)  HYDROmorphone (DILAUDID) injection 0.5 mg (0.5 mg Intravenous Given 09/08/22 2323)    ED Course/ Medical Decision Making/ A&P                             Medical Decision Making Patient is a 27 year old female, here for abdominal pain, and epigastric area, she has a history of chronic pancreatitis.  She feels like the symptoms are similar to previously.  Will obtain labs, and treat her pain.  Amount and/or Complexity of Data Reviewed Labs: ordered.    Details: Lipase within normal limits, no evidence of leukocytosis. Discussion of management or test interpretation with external provider(s): Patient's lipase is within normal limits, but given history of chronic pancreatitis, it can be normal.  She is likely having a flare, I have treated her with 2 doses of pain medications and she is feeling much better.  I discharged her with some Percocet for home, and instructed her to follow-up with her primary care doctor.  We did not scan her abdomen at this time, she feels this is very similar to her previous episodes, and she has showed improvement.  She has no red flag symptoms.  Risk Prescription drug management.    Final Clinical Impression(s) / ED Diagnoses Final diagnoses:  Chronic pancreatitis, unspecified pancreatitis type [facility])    Rx / DC Orders ED Discharge Orders          Ordered    oxyCODONE-acetaminophen (PERCOCET/ROXICET) 5-325 MG tablet  Every 6 hours PRN        09/08/22 2348    ondansetron (ZOFRAN-ODT) 4 MG disintegrating tablet  Every 8 hours PRN        09/08/22 2349              Neeti Knudtson Elbert Ewings, PA 09/08/22 2350    Terrilee Files, MD 09/09/22 1003

## 2022-09-08 NOTE — ED Triage Notes (Signed)
Pt arrives with c/o ABD pain that started today. Pt has hx of pancreatitis. Pt endorses n/v and headache.

## 2022-09-24 ENCOUNTER — Ambulatory Visit: Payer: BLUE CROSS/BLUE SHIELD | Admitting: Orthopaedic Surgery

## 2022-09-29 ENCOUNTER — Other Ambulatory Visit: Payer: Self-pay

## 2022-09-29 ENCOUNTER — Ambulatory Visit
Admission: EM | Admit: 2022-09-29 | Discharge: 2022-09-29 | Disposition: A | Discharge status: Home / Self Care | Payer: BLUE CROSS/BLUE SHIELD | Attending: Family Medicine | Admitting: Family Medicine

## 2022-09-29 DIAGNOSIS — J039 Acute tonsillitis, unspecified: Secondary | ICD-10-CM

## 2022-09-29 LAB — POCT RAPID STREP A (OFFICE): Rapid Strep A Screen: NEGATIVE

## 2022-09-29 MED ORDER — AMOXICILLIN 875 MG PO TABS: 875.0000 mg | ORAL_TABLET | Freq: Two times a day (BID) | ORAL | 0 refills | Status: DC

## 2022-09-29 NOTE — ED Provider Notes (Signed)
[facility]    CSN: [id number] Arrival date & time: 09/29/22  1908      History   Chief Complaint Chief Complaint  Patient presents with   Sore Throat    HPI Shannon Holloway is a 27 y.o. female.   HPI  Patient states that she started with a sore throat this morning.  Its gotten worse throughout the day.  Now she feels like she can hardly swallow.  No fever or chills.  No headache or bodyaches.  She has not been exposed to anybody with strep that she knows of.  She does work in a Environmental health practitioner.  Chart is reviewed.  She had a recent ER visit for pancreatitis.  She states this resolved  Past Medical History:  Diagnosis Date   ADHD (attention deficit hyperactivity disorder)    Anxiety    Depression    Pancreatitis 2012    Patient Active Problem List   Diagnosis Date Noted   Acute pancreatitis 01/18/2021   Acute pancreatitis after endoscopic retrograde cholangiopancreatography (ERCP) 01/17/2021   Pancreatitis 08/17/2020   Tobacco abuse 08/17/2020   History of panic attacks 05/19/2016   Major depressive disorder 05/19/2016    Past Surgical History:  Procedure Laterality Date   CHOLECYSTECTOMY  2012   HERNIA REPAIR  2001    OB History   No obstetric history on file.      Home Medications    Prior to Admission medications   Medication Sig Start Date End Date Taking? Authorizing Provider  amoxicillin (AMOXIL) 875 MG tablet Take 1 tablet (875 mg total) by mouth 2 (two) times daily. 09/29/22  Yes Eustace Moore, MD  norelgestromin-ethinyl estradiol [employer]) 150-35 MCG/24HR transdermal patch Place 1 patch onto the skin once a week.    [provider]  ondansetron (ZOFRAN-ODT) 4 MG disintegrating tablet Take 1 tablet (4 mg total) by mouth every 8 (eight) hours as needed for nausea or vomiting. 09/08/22   Small, Brooke L, PA  buPROPion (WELLBUTRIN XL) 150 MG 24 hr tablet Take 1 tablet (150 mg total) by mouth daily. Patient not  taking: Reported on 12/21/2018 02/24/17 12/21/18  Thresa Ross, MD  VYVANSE 20 MG capsule Take 1 capsule (20 mg total) by mouth every morning. Patient not taking: Reported on 11/24/2018 11/23/16 12/21/18  Thresa Ross, MD    Family History Family History  Problem Relation Age of Onset   Anxiety disorder Mother    Anxiety disorder Maternal Grandmother     Social History Social History   Tobacco Use   Smoking status: Never   Smokeless tobacco: Never  Vaping Use   Vaping Use: Never used  Substance Use Topics   Alcohol use: Yes    Comment: social   Drug use: Yes    Types: Marijuana     Allergies   Patient has no known allergies.   Review of Systems Review of Systems See HPI  Physical Exam Triage Vital Signs ED Triage Vitals [09/29/22 1918]  Enc Vitals Group     BP 132/85     Pulse Rate 91     Resp 17     Temp 98.4 F (36.9 C)     Temp Source Oral     SpO2 100 %     Weight      Height      Head Circumference      Peak Flow      Pain Score 5     Pain Loc  Pain Edu?      Excl. in GC?    No data found.  Updated Vital Signs BP 132/85 (BP Location: Left Arm)   Pulse 91   Temp 98.4 F (36.9 C) (Oral)   Resp 17   SpO2 100%   :     Physical Exam Constitutional:      General: She is not in acute distress.    Appearance: She is well-developed and normal weight.  HENT:     Head: Normocephalic and atraumatic.     Right Ear: Tympanic membrane normal. Tympanic membrane is not erythematous.     Left Ear: Tympanic membrane normal. Tympanic membrane is not erythematous.     Nose: No congestion or rhinorrhea.     Mouth/Throat:     Pharynx: Uvula midline. Pharyngeal swelling and posterior oropharyngeal erythema present.     Tonsils: Tonsillar exudate present. No tonsillar abscesses. 3+ on the right. 3+ on the left.     Comments: Tonsils are swollen and quite erythematous.  Nearly touching midline.  Covered with exudate Eyes:     Conjunctiva/sclera:  Conjunctivae normal.     Pupils: Pupils are equal, round, and reactive to light.  Cardiovascular:     Rate and Rhythm: Normal rate.  Pulmonary:     Effort: Pulmonary effort is normal. No respiratory distress.  Abdominal:     General: There is no distension.     Palpations: Abdomen is soft.  Musculoskeletal:        General: Normal range of motion.     Cervical back: Normal range of motion.  Lymphadenopathy:     Cervical: Cervical adenopathy present.  Skin:    General: Skin is warm and dry.  Neurological:     Mental Status: She is alert.      UC Treatments / Results  Labs (all labs ordered are listed, but only abnormal results are displayed) Labs Reviewed  CULTURE, GROUP A STREP [facility])  POCT RAPID STREP A (OFFICE)    EKG   Radiology No results found.  Procedures Procedures (including critical care time)  Medications Ordered in UC Medications - No data to display  Initial Impression / Assessment and Plan / UC Course  I have reviewed the triage vital signs and the nursing notes.  Pertinent labs & imaging results that were available during my care of the patient were reviewed by me and considered in my medical decision making (see chart for details).     The patient has large tonsils with exudate, painful sore throat.  I going to place her on antibiotics until the culture is available.  Patient is instructed Final Clinical Impressions(s) / UC Diagnoses   Final diagnoses:  Exudative tonsillitis     Discharge Instructions      Make sure you are drinking lots of water May use sore throat spray or lozenges for pain May take Tylenol or ibuprofen for pain and fever I have sent the swab to the laboratory for culture I am going to keep you antibiotics to take until the culture is available.  If the culture is negative you may stop the antibiotics early.  If the culture is positive you need to take 10 full days of penicillin     ED Prescriptions     Medication  Sig Dispense Auth. Provider   amoxicillin (AMOXIL) 875 MG tablet Take 1 tablet (875 mg total) by mouth 2 (two) times daily. 14 tablet Eustace Moore, MD      PDMP not  reviewed this encounter.   Eustace Moore, MD 09/29/22 [phone]

## 2022-09-29 NOTE — ED Triage Notes (Signed)
Pt c/o sore throat since this morning. Denies fever. Ibuprofen prn.

## 2022-09-29 NOTE — Discharge Instructions (Addendum)
Make sure you are drinking lots of water May use sore throat spray or lozenges for pain May take Tylenol or ibuprofen for pain and fever I have sent the swab to the laboratory for culture I am going to keep you antibiotics to take until the culture is available.  If the culture is negative you may stop the antibiotics early.  If the culture is positive you need to take 10 full days of penicillin

## 2022-10-01 LAB — CULTURE, GROUP A STREP (THRC)

## 2022-10-02 LAB — CULTURE, GROUP A STREP (THRC)

## 2022-10-12 ENCOUNTER — Ambulatory Visit
Admission: EM | Admit: 2022-10-12 | Discharge: 2022-10-12 | Disposition: A | Discharge status: Home / Self Care | Payer: BLUE CROSS/BLUE SHIELD | Attending: Family Medicine | Admitting: Family Medicine

## 2022-10-12 DIAGNOSIS — L03113 Cellulitis of right upper limb: Secondary | ICD-10-CM

## 2022-10-12 DIAGNOSIS — S61431A Puncture wound without foreign body of right hand, initial encounter: Secondary | ICD-10-CM | POA: Diagnosis not present

## 2022-10-12 MED ORDER — AMOXICILLIN-POT CLAVULANATE 875-125 MG PO TABS: 1.0000 | ORAL_TABLET | Freq: Two times a day (BID) | ORAL | 0 refills | Status: AC

## 2022-10-12 NOTE — Discharge Instructions (Addendum)
Advised patient to take medication as directed with food to completion.  Encouraged increase daily water intake to 64 ounces per day while taking this medication.  Advised if symptoms worsen and/or unresolved please follow-up with PCP or here for further evaluation. 

## 2022-10-12 NOTE — ED Triage Notes (Addendum)
Pt reports she was attempting to help cat out of stuck area and the car got scared and punctured her hand on the palm of her right hand. Pt reports this occurred on Sunday. Pt reports shooting pain and drainage of white liquid out of wound site this morning.  Pt reports she took an old amoxicillin this morning

## 2022-10-12 NOTE — ED Provider Notes (Signed)
[facility]    CSN: [id number] Arrival date & time: 10/12/22  1004      History   Chief Complaint Chief Complaint  Patient presents with   cat scratch     HPI Shannon Holloway is a 27 y.o. female.   HPI 27 year old female presents with puncture wound of right hand due to cat bite that occurred on Sunday night.  PMH significant for ADHD, obesity, and tobacco abuse.  Past Medical History:  Diagnosis Date   ADHD (attention deficit hyperactivity disorder)    Anxiety    Depression    Pancreatitis 2012    Patient Active Problem List   Diagnosis Date Noted   Acute pancreatitis 01/18/2021   Acute pancreatitis after endoscopic retrograde cholangiopancreatography (ERCP) 01/17/2021   Pancreatitis 08/17/2020   Tobacco abuse 08/17/2020   History of panic attacks 05/19/2016   Major depressive disorder 05/19/2016    Past Surgical History:  Procedure Laterality Date   CHOLECYSTECTOMY  2012   HERNIA REPAIR  2001    OB History   No obstetric history on file.      Home Medications    Prior to Admission medications   Medication Sig Start Date End Date Taking? Authorizing Provider  amoxicillin-clavulanate (AUGMENTIN) 875-125 MG tablet Take 1 tablet by mouth every 12 (twelve) hours. 10/12/22  Yes Trevor Iha, FNP  norelgestromin-ethinyl estradiol [employer] [facility]) 150-35 MCG/24HR transdermal patch Place 1 patch onto the skin once a week.    [provider]  ondansetron (ZOFRAN-ODT) 4 MG disintegrating tablet Take 1 tablet (4 mg total) by mouth every 8 (eight) hours as needed for nausea or vomiting. 09/08/22   Small, Brooke L, PA  buPROPion (WELLBUTRIN XL) 150 MG 24 hr tablet Take 1 tablet (150 mg total) by mouth daily. Patient not taking: Reported on 12/21/2018 02/24/17 12/21/18  Thresa Ross, MD  VYVANSE 20 MG capsule Take 1 capsule (20 mg total) by mouth every morning. Patient not taking: Reported on 11/24/2018 11/23/16 12/21/18  Thresa Ross, MD    Family  History Family History  Problem Relation Age of Onset   Anxiety disorder Mother    Anxiety disorder Maternal Grandmother     Social History Social History   Tobacco Use   Smoking status: Never   Smokeless tobacco: Never  Vaping Use   Vaping status: Every Day   Substances: Nicotine, Flavoring  Substance Use Topics   Alcohol use: Yes    Comment: social   Drug use: Yes    Types: Marijuana     Allergies   Patient has no known allergies.   Review of Systems Review of Systems  Skin:  Positive for wound.     Physical Exam Triage Vital Signs ED Triage Vitals  Encounter Vitals Group     BP 10/12/22 1017 137/82     Systolic BP Percentile --      Diastolic BP Percentile --      Pulse Rate 10/12/22 1017 84     Resp 10/12/22 1017 16     Temp 10/12/22 1017 98.2 F (36.8 C)     Temp Source 10/12/22 1017 Oral     SpO2 10/12/22 1017 96 %     Weight --      Height --      Head Circumference --      Peak Flow --      Pain Score 10/12/22 1015 5     Pain Loc --      Pain Education --  Exclude from Growth Chart --    No data found.  Updated Vital Signs BP 137/82   Pulse 84   Temp 98.2 F (36.8 C) (Oral)   Resp 16   LMP 10/07/2022   SpO2 96%      Physical Exam Vitals and nursing note reviewed.  Constitutional:      Appearance: Normal appearance. She is obese.  HENT:     Head: Normocephalic and atraumatic.     Mouth/Throat:     Mouth: Mucous membranes are moist.     Pharynx: Oropharynx is clear.  Eyes:     Extraocular Movements: Extraocular movements intact.     Conjunctiva/sclera: Conjunctivae normal.     Pupils: Pupils are equal, round, and reactive to light.  Cardiovascular:     Rate and Rhythm: Normal rate and regular rhythm.     Pulses: Normal pulses.     Heart sounds: Normal heart sounds.  Pulmonary:     Effort: Pulmonary effort is normal.     Breath sounds: Normal breath sounds. No wheezing, rhonchi or rales.  Musculoskeletal:         General: Normal range of motion.     Cervical back: Normal range of motion and neck supple.  Skin:    General: Skin is warm and dry.     Comments: Right palmar surface: Tiny (<0.5 cm) erythematous puncture wound with eschar formation-please see image below  Neurological:     General: No focal deficit present.     Mental Status: She is alert and oriented to person, place, and time. Mental status is at baseline.      UC Treatments / Results  Labs (all labs ordered are listed, but only abnormal results are displayed) Labs Reviewed - No data to display  EKG   Radiology No results found.  Procedures Procedures (including critical care time)  Medications Ordered in UC Medications - No data to display  Initial Impression / Assessment and Plan / UC Course  I have reviewed the triage vital signs and the nursing notes.  Pertinent labs & imaging results that were available during my care of the patient were reviewed by me and considered in my medical decision making (see chart for details).     MDM: 1.  Cellulitis of right hand-Rx'd Augmentin 875/125 mg tablet twice daily x 7 days; 2.  Puncture wound of right hand without foreign body, initial encounter-Rx Augmentin 875/125 mg tablet twice daily x 7 days. Advised patient to take medication as directed with food to completion.  Encouraged increase daily water intake to 64 ounces per day while taking this medication.  Advised if symptoms worsen and/or unresolved please follow-up with PCP or here for further evaluation.  Patient discharged home, hemodynamically stable.  Final Clinical Impressions(s) / UC Diagnoses   Final diagnoses:  Puncture wound of right hand without foreign body, initial encounter  Cellulitis of right hand     Discharge Instructions      Advised patient to take medication as directed with food to completion.  Encouraged increase daily water intake to 64 ounces per day while taking this medication.  Advised if  symptoms worsen and/or unresolved please follow-up with PCP or here for further evaluation.     ED Prescriptions     Medication Sig Dispense Auth. Provider   amoxicillin-clavulanate (AUGMENTIN) 875-125 MG tablet Take 1 tablet by mouth every 12 (twelve) hours. 14 tablet Trevor Iha, FNP      PDMP not reviewed this encounter.  Trevor Iha, FNP 10/12/22 1132

## 2023-05-10 ENCOUNTER — Emergency Department (HOSPITAL_BASED_OUTPATIENT_CLINIC_OR_DEPARTMENT_OTHER)
Admission: EM | Admit: 2023-05-10 | Discharge: 2023-05-10 | Disposition: A | Discharge status: Home / Self Care | Payer: BLUE CROSS/BLUE SHIELD | Attending: Emergency Medicine | Admitting: Emergency Medicine

## 2023-05-10 ENCOUNTER — Other Ambulatory Visit: Payer: Self-pay

## 2023-05-10 ENCOUNTER — Emergency Department (HOSPITAL_BASED_OUTPATIENT_CLINIC_OR_DEPARTMENT_OTHER): Payer: BLUE CROSS/BLUE SHIELD

## 2023-05-10 ENCOUNTER — Encounter (HOSPITAL_BASED_OUTPATIENT_CLINIC_OR_DEPARTMENT_OTHER): Payer: Self-pay

## 2023-05-10 DIAGNOSIS — J209 Acute bronchitis, unspecified: Secondary | ICD-10-CM | POA: Diagnosis not present

## 2023-05-10 DIAGNOSIS — R059 Cough, unspecified: Secondary | ICD-10-CM | POA: Diagnosis present

## 2023-05-10 DIAGNOSIS — B338 Other specified viral diseases: Secondary | ICD-10-CM

## 2023-05-10 DIAGNOSIS — B974 Respiratory syncytial virus as the cause of diseases classified elsewhere: Secondary | ICD-10-CM | POA: Diagnosis not present

## 2023-05-10 DIAGNOSIS — R0789 Other chest pain: Secondary | ICD-10-CM | POA: Diagnosis not present

## 2023-05-10 LAB — BASIC METABOLIC PANEL
Anion gap: 8 (ref 5–15)
BUN: 10 mg/dL (ref 6–20)
CO2: 25 mmol/L (ref 22–32)
Calcium: 9.3 mg/dL (ref 8.9–10.3)
Chloride: 104 mmol/L (ref 98–111)
Creatinine, Ser: 0.94 mg/dL (ref 0.44–1.00)
GFR, Estimated: >60 mL/min (ref >60)
Glucose, Bld: 90 mg/dL (ref 70–99)
Potassium: 4.2 mmol/L (ref 3.5–5.1)
Sodium: 137 mmol/L (ref 135–145)

## 2023-05-10 LAB — HEPATIC FUNCTION PANEL
ALT: 13 U/L (ref 0–44)
AST: 16 U/L (ref 15–41)
Albumin: 4 g/dL (ref 3.5–5.0)
Alkaline Phosphatase: 55 U/L (ref 38–126)
Bilirubin, Direct: 0.1 mg/dL (ref 0.0–0.2)
Indirect Bilirubin: 0.6 mg/dL (ref 0.3–0.9)
Total Bilirubin: 0.7 mg/dL (ref 0.0–1.2)
Total Protein: 7.7 g/dL (ref 6.5–8.1)

## 2023-05-10 LAB — RESP PANEL BY RT-PCR (RSV, FLU A&B, COVID)  RVPGX2
Influenza A by PCR: NEGATIVE
Influenza B by PCR: NEGATIVE
Resp Syncytial Virus by PCR: POSITIVE — AB
SARS Coronavirus 2 by RT PCR: NEGATIVE

## 2023-05-10 LAB — CBC WITH DIFFERENTIAL/PLATELET
Abs Immature Granulocytes: 0.01 10*3/uL (ref 0.00–0.07)
Basophils Absolute: 0 10*3/uL (ref 0.0–0.1)
Basophils Relative: 0 %
Eosinophils Absolute: 0.2 10*3/uL (ref 0.0–0.5)
Eosinophils Relative: 3 %
HCT: 38.1 % (ref 36.0–46.0)
Hemoglobin: 12.6 g/dL (ref 12.0–15.0)
Immature Granulocytes: 0 %
Lymphocytes Relative: 19 %
Lymphs Abs: 1.1 10*3/uL (ref 0.7–4.0)
MCH: 29.9 pg (ref 26.0–34.0)
MCHC: 33.1 g/dL (ref 30.0–36.0)
MCV: 90.5 fL (ref 80.0–100.0)
Monocytes Absolute: 0.6 10*3/uL (ref 0.1–1.0)
Monocytes Relative: 9 %
Neutro Abs: 4.2 10*3/uL (ref 1.7–7.7)
Neutrophils Relative %: 69 %
Platelets: 221 10*3/uL (ref 150–400)
RBC: 4.21 MIL/uL (ref 3.87–5.11)
RDW: 12.2 % (ref 11.5–15.5)
WBC: 6.1 10*3/uL (ref 4.0–10.5)
nRBC: 0 % (ref 0.0–0.2)

## 2023-05-10 LAB — LIPASE, BLOOD: Lipase: 28 U/L (ref 11–51)

## 2023-05-10 LAB — TROPONIN I (HIGH SENSITIVITY): Troponin I (High Sensitivity): <2 ng/L (ref <18)

## 2023-05-10 LAB — HCG, SERUM, QUALITATIVE: Preg, Serum: NEGATIVE

## 2023-05-10 LAB — D-DIMER, QUANTITATIVE: D-Dimer, Quant: <0.27 ug{FEU}/mL (ref 0.00–0.50)

## 2023-05-10 MED ORDER — KETOROLAC TROMETHAMINE 10 MG PO TABS: 10.0000 mg | ORAL_TABLET | Freq: Four times a day (QID) | ORAL | 0 refills | Status: AC | PRN

## 2023-05-10 MED ORDER — PREDNISONE 10 MG PO TABS: 40.0000 mg | ORAL_TABLET | Freq: Every day | ORAL | 0 refills | Status: AC

## 2023-05-10 MED ORDER — BENZONATATE 100 MG PO CAPS: 100.0000 mg | ORAL_CAPSULE | Freq: Three times a day (TID) | ORAL | 0 refills | Status: AC

## 2023-05-10 MED ORDER — KETOROLAC TROMETHAMINE 15 MG/ML IJ SOLN
15.0000 mg | Freq: Once | INTRAMUSCULAR | Status: AC
  Administered 2023-05-10: 15 mg via INTRAVENOUS
  Filled 2023-05-10: qty 1

## 2023-05-10 NOTE — ED Provider Notes (Signed)
[facility] Provider Note   CSN: [id number] Arrival date & time: 05/10/23  0720     History  Chief Complaint  Patient presents with   Chest Pain    Zykeria Laguardia is a 28 y.o. female.   Chest Pain Associated symptoms: cough and shortness of breath      28 year old female with medical history significant for anxiety, depression, chronic pancreatitis, ADHD, history of cholecystectomy who presents to the emergency department with roughly 3 weeks of right sided chest pain.  Pain is described as sharp and pleuritic.  She states that her pancreatitis had been acting up recently but has since calm down and she denies any abdominal pain at this time.  Additionally, over the past weekend she started developing upper respiratory congestion with nasal congestion, worsening cough, worsening sharp right-sided chest pain with associated shortness of breath.  She thinks she may have sick contacts in the form of coworkers.  Pain is moderate to severe in intensity, worse with taking a deep breath.  Home Medications Prior to Admission medications   Medication Sig Start Date End Date Taking? Authorizing Provider  amoxicillin-clavulanate (AUGMENTIN) 875-125 MG tablet Take 1 tablet by mouth every 12 (twelve) hours. 10/12/22   Trevor Iha, FNP  benzonatate (TESSALON) 100 MG capsule Take 1 capsule (100 mg total) by mouth every 8 (eight) hours. 05/10/23  Yes Ernie Avena, MD  ketorolac (TORADOL) 10 MG tablet Take 1 tablet (10 mg total) by mouth every 6 (six) hours as needed. 05/10/23  Yes Ernie Avena, MD  norelgestromin-ethinyl estradiol [facility]) 150-35 MCG/24HR transdermal patch Place 1 patch onto the skin once a week.    [provider]  ondansetron (ZOFRAN-ODT) 4 MG disintegrating tablet Take 1 tablet (4 mg total) by mouth every 8 (eight) hours as needed for nausea or vomiting. 09/08/22   Small, Brooke L, PA  predniSONE (DELTASONE) 10 MG  tablet Take 4 tablets (40 mg total) by mouth daily for 5 days. 05/10/23 05/15/23 Yes Ernie Avena, MD  buPROPion (WELLBUTRIN XL) 150 MG 24 hr tablet Take 1 tablet (150 mg total) by mouth daily. Patient not taking: Reported on 12/21/2018 02/24/17 12/21/18  Thresa Ross, MD  VYVANSE 20 MG capsule Take 1 capsule (20 mg total) by mouth every morning. Patient not taking: Reported on 11/24/2018 11/23/16 12/21/18  Thresa Ross, MD      Allergies    Patient has no known allergies.    Review of Systems   Review of Systems  HENT:  Positive for congestion.   Respiratory:  Positive for cough and shortness of breath.   Cardiovascular:  Positive for chest pain.  All other systems reviewed and are negative.   Physical Exam Updated Vital Signs BP (!) 135/94   Pulse 91   Temp 98.4 F (36.9 C) (Oral)   Resp (!) 25   Ht 5\' 7"  (1.702 m)   Wt 79.4 kg   LMP 04/09/2023 (Exact Date)   SpO2 98%   BMI 27.41 kg/m  Physical Exam Vitals and nursing note reviewed.  Constitutional:      General: She is not in acute distress.    Appearance: She is well-developed.  HENT:     Head: Normocephalic and atraumatic.  Eyes:     Conjunctiva/sclera: Conjunctivae normal.  Cardiovascular:     Rate and Rhythm: Normal rate and regular rhythm.     Heart sounds: No murmur heard. Pulmonary:     Effort: Pulmonary effort is normal. No  respiratory distress.     Breath sounds: Normal breath sounds.  Chest:     Comments: Right-sided chest wall tenderness to palpation that reproduces the patient's pain Abdominal:     Palpations: Abdomen is soft.     Tenderness: There is no abdominal tenderness.  Musculoskeletal:        General: No swelling.     Cervical back: Neck supple.  Skin:    General: Skin is warm and dry.     Capillary Refill: Capillary refill takes less than 2 seconds.  Neurological:     Mental Status: She is alert.  Psychiatric:        Mood and Affect: Mood normal.     ED Results / Procedures /  Treatments   Labs (all labs ordered are listed, but only abnormal results are displayed) Labs Reviewed  RESP PANEL BY RT-PCR (RSV, FLU A&B, COVID)  RVPGX2 - Abnormal; Notable for the following components:      Result Value   Resp Syncytial Virus by PCR POSITIVE (*)    All other components within normal limits  CBC WITH DIFFERENTIAL/PLATELET  BASIC METABOLIC PANEL  D-DIMER, QUANTITATIVE  LIPASE, BLOOD  HEPATIC FUNCTION PANEL  HCG, SERUM, QUALITATIVE  TROPONIN I (HIGH SENSITIVITY)    EKG EKG Interpretation Date/Time:  Tuesday May 10 2023 08:12:06 EST Ventricular Rate:  76 PR Interval:  142 QRS Duration:  100 QT Interval:  378 QTC Calculation: 425 R Axis:   61  Text Interpretation: Sinus rhythm Confirmed by Ernie Avena ([id number]) on 05/10/2023 8:27:29 AM  Radiology DG Chest 2 View Result Date: 05/10/2023 CLINICAL DATA:  Right-sided chest pain beginning yesterday. EXAM: CHEST - 2 VIEW COMPARISON:  05/09/2018 FINDINGS: The heart size and mediastinal contours are within normal limits. Both lungs are clear. The visualized skeletal structures are unremarkable. IMPRESSION: Normal exam. Electronically Signed   By: Danae Orleans M.D.   On: 05/10/2023 08:11    Procedures Procedures    Medications Ordered in ED Medications  ketorolac (TORADOL) 15 MG/ML injection 15 mg (15 mg Intravenous Given 05/10/23 0844)    ED Course/ Medical Decision Making/ A&P Clinical Course as of 05/10/23 0932  Tue May 10, 2023  0915 Respiratory Syncytial Virus by PCR(!): POSITIVE [JL]    Clinical Course User Index [JL] Ernie Avena, MD                                 Medical Decision Making Amount and/or Complexity of Data Reviewed Labs: ordered. Decision-making details documented in ED Course. Radiology: ordered.  Risk Prescription drug management.    28 year old female with medical history significant for anxiety, depression, chronic pancreatitis, ADHD, history of cholecystectomy who  presents to the emergency department with roughly 3 weeks of right sided chest pain.  Pain is described as sharp and pleuritic.  She states that her pancreatitis had been acting up recently but has since calm down and she denies any abdominal pain at this time.  Additionally, over the past weekend she started developing upper respiratory congestion with nasal congestion, worsening cough, worsening sharp right-sided chest pain with associated shortness of breath.  She thinks she may have sick contacts in the form of coworkers.  Pain is moderate to severe in intensity, worse with taking a deep breath.  On arrival, the patient was afebrile, initially tachycardic, heart rate 121, improved to 91 without intervention, respiratory rate 22, BP 139/106, saturating 95% on room air.  Sinus rhythm noted on cardiac telemetry.  Physical exam revealed clear lungs, right-sided chest wall tenderness to palpation that reproduces the patient's pain.  Patient presents with shortness of breath for 3 weeks, recent new onset nasal congestion over the last few days.  Mild cough.  Differential diagnose includes viral infection, bronchitis, pneumothorax, pneumonia, PE, less likely ACS, less likely pericarditis/myocarditis, patient without a gallbladder, patient without epigastric tenderness, low concern for intra-abdominal etiology.  Initial EKG revealed sinus rhythm, ventricular rate 76, no evidence of pericarditis or acute ischemic changes. A chest x-ray was performed which revealed clear lungs, no acute intrathoracic abnormality.  Laboratory evaluation revealed negative D-dimer, lipase normal, initial negative troponin less than 2, BMP unremarkable, hepatic function panel unremarkable, CBC without a leukocytosis or anemia.  PCR testing will resulted negative for COVID and influenza but positive for RSV.  The patient was administered IV Toradol with subsequent improvement in her chest wall discomfort.  Suspect likely RSV infection  causing pleuritis and/or costochondritis with reproducible chest wall tenderness to palpation on exam.  Patient symptoms have been ongoing for 3 weeks however acute infectious symptoms appear to be more short in duration.  Considered acute bronchitis, will treat with a course of steroids, oral Toradol, Tessalon, recommended continued supportive care with Tylenol as well, continued fluid rehydration.  Stable for discharge.   Final Clinical Impression(s) / ED Diagnoses Final diagnoses:  RSV (respiratory syncytial virus infection)  Acute bronchitis, unspecified organism  Chest wall pain    Rx / DC Orders ED Discharge Orders          Ordered    predniSONE (DELTASONE) 10 MG tablet  Daily        05/10/23 0918    benzonatate (TESSALON) 100 MG capsule  Every 8 hours        05/10/23 0918    ketorolac (TORADOL) 10 MG tablet  Every 6 hours PRN        05/10/23 0918              Ernie Avena, MD 05/10/23 [phone]

## 2023-05-10 NOTE — Discharge Instructions (Addendum)
You tested positive for RSV.  Given your previous duration of symptoms, will treat for acute bronchitis with a course of steroids, Tessalon for cough suppression as well as oral Toradol given your chest wall pain.  The remainder of your workup was overall reassuring.

## 2023-05-10 NOTE — ED Triage Notes (Signed)
Pt states that she has right sided sharp chest pain. Currently an 8/10 pain, started last night around 5/10 and has been increasing since then. Pt states she also has hx of pancreatitis.  Robina Ade, RN

## 2023-07-12 ENCOUNTER — Encounter (HOSPITAL_BASED_OUTPATIENT_CLINIC_OR_DEPARTMENT_OTHER): Payer: Self-pay | Admitting: Emergency Medicine

## 2023-07-12 ENCOUNTER — Other Ambulatory Visit: Payer: Self-pay

## 2023-07-12 DIAGNOSIS — K861 Other chronic pancreatitis: Secondary | ICD-10-CM | POA: Diagnosis not present

## 2023-07-12 DIAGNOSIS — R1033 Periumbilical pain: Secondary | ICD-10-CM | POA: Diagnosis present

## 2023-07-12 LAB — COMPREHENSIVE METABOLIC PANEL WITH GFR
ALT: 13 U/L (ref 0–44)
AST: 22 U/L (ref 15–41)
Albumin: 4.5 g/dL (ref 3.5–5.0)
Alkaline Phosphatase: 62 U/L (ref 38–126)
Anion gap: 14 (ref 5–15)
BUN: 10 mg/dL (ref 6–20)
CO2: 21 mmol/L — ABNORMAL LOW (ref 22–32)
Calcium: 9.5 mg/dL (ref 8.9–10.3)
Chloride: 104 mmol/L (ref 98–111)
Creatinine, Ser: 1 mg/dL (ref 0.44–1.00)
GFR, Estimated: >60 mL/min (ref >60)
Glucose, Bld: 124 mg/dL — ABNORMAL HIGH (ref 70–99)
Potassium: 4.2 mmol/L (ref 3.5–5.1)
Sodium: 139 mmol/L (ref 135–145)
Total Bilirubin: 0.3 mg/dL (ref 0.0–1.2)
Total Protein: 7.8 g/dL (ref 6.5–8.1)

## 2023-07-12 LAB — CBC
HCT: 37.7 % (ref 36.0–46.0)
Hemoglobin: 12.2 g/dL (ref 12.0–15.0)
MCH: 29.5 pg (ref 26.0–34.0)
MCHC: 32.4 g/dL (ref 30.0–36.0)
MCV: 91.1 fL (ref 80.0–100.0)
Platelets: 225 10*3/uL (ref 150–400)
RBC: 4.14 MIL/uL (ref 3.87–5.11)
RDW: 12.2 % (ref 11.5–15.5)
WBC: 7.4 10*3/uL (ref 4.0–10.5)
nRBC: 0 % (ref 0.0–0.2)

## 2023-07-12 LAB — LIPASE, BLOOD: Lipase: 55 U/L — ABNORMAL HIGH (ref 11–51)

## 2023-07-12 NOTE — ED Triage Notes (Signed)
 Pt reports recent pancreatitis flare and her medications are not working. Tonight after getting off work felt nauseated and that her heart is racing.

## 2023-07-13 ENCOUNTER — Emergency Department (HOSPITAL_BASED_OUTPATIENT_CLINIC_OR_DEPARTMENT_OTHER)
Admission: EM | Admit: 2023-07-13 | Discharge: 2023-07-13 | Disposition: A | Discharge status: Home / Self Care | Attending: Emergency Medicine | Admitting: Emergency Medicine

## 2023-07-13 DIAGNOSIS — K861 Other chronic pancreatitis: Secondary | ICD-10-CM

## 2023-07-13 LAB — HCG, QUANTITATIVE, PREGNANCY: hCG, Beta Chain, Quant, S: <1 m[IU]/mL (ref <5)

## 2023-07-13 MED ORDER — TRAMADOL HCL 50 MG PO TABS: 50.0000 mg | ORAL_TABLET | Freq: Four times a day (QID) | ORAL | 0 refills | Status: AC | PRN

## 2023-07-13 MED ORDER — FENTANYL CITRATE PF 50 MCG/ML IJ SOSY
50.0000 ug | PREFILLED_SYRINGE | Freq: Once | INTRAMUSCULAR | Status: AC
  Administered 2023-07-13: 50 ug via INTRAVENOUS
  Filled 2023-07-13: qty 1

## 2023-07-13 MED ORDER — DROPERIDOL 2.5 MG/ML IJ SOLN
1.2500 mg | Freq: Once | INTRAMUSCULAR | Status: AC
  Administered 2023-07-13: 1.25 mg via INTRAVENOUS
  Filled 2023-07-13: qty 2

## 2023-07-13 MED ORDER — KETOROLAC TROMETHAMINE 30 MG/ML IJ SOLN
15.0000 mg | Freq: Once | INTRAMUSCULAR | Status: AC
Start: 2023-07-13 — End: 2023-07-13
  Administered 2023-07-13: 15 mg via INTRAVENOUS
  Filled 2023-07-13: qty 1

## 2023-07-13 NOTE — ED Provider Notes (Signed)
 [place] Provider Note   CSN: [id number] Arrival date & time: 07/12/23  2055     History  Chief Complaint  Patient presents with   Tachycardia    Shannon Holloway is a 28 y.o. female.  The history is provided by the patient.  Abdominal Pain Pain location:  Periumbilical Pain radiates to:  Does not radiate Pain severity:  Severe Duration:  2 weeks Timing:  Constant Chronicity:  Recurrent Context: not suspicious food intake and not trauma   Relieved by:  Nothing Worsened by:  Nothing Ineffective treatments:  None tried Associated symptoms: no fever and no vomiting   Risk factors: has not had multiple surgeries   Patient with chronic pancreatitis presents with a flair of 2 weeks duration. Has been taking tylenol  and ibuprofen  during this time, but tonight this was not sufficient to control her pain.      Past Medical History:  Diagnosis Date   ADHD (attention deficit hyperactivity disorder)    Anxiety    Depression    Pancreatitis 2012     Home Medications Prior to Admission medications   Medication Sig Start Date End Date Taking? Authorizing Provider  amoxicillin -clavulanate (AUGMENTIN ) 875-125 MG tablet Take 1 tablet by mouth every 12 (twelve) hours. 10/12/22   Ragan, Michael, FNP  benzonatate  (TESSALON ) 100 MG capsule Take 1 capsule (100 mg total) by mouth every 8 (eight) hours. 05/10/23   Rosealee Concha, MD  ketorolac  (TORADOL ) 10 MG tablet Take 1 tablet (10 mg total) by mouth every 6 (six) hours as needed. 05/10/23   Rosealee Concha, MD  norelgestromin-ethinyl estradiol (XULANE) 150-35 MCG/24HR transdermal patch Place 1 patch onto the skin once a week.    [provider]  ondansetron  (ZOFRAN -ODT) 4 MG disintegrating tablet Take 1 tablet (4 mg total) by mouth every 8 (eight) hours as needed for nausea or vomiting. 09/08/22   Small, Brooke L, PA  traMADol  (ULTRAM ) 50 MG tablet Take 1 tablet (50 mg total) by  mouth every 6 (six) hours as needed for severe pain (pain score 7-10). 07/13/23  Yes Jenness Stemler, MD  buPROPion  (WELLBUTRIN  XL) 150 MG 24 hr tablet Take 1 tablet (150 mg total) by mouth daily. Patient not taking: Reported on 12/21/2018 02/24/17 12/21/18  Wray Heady, MD  VYVANSE  20 MG capsule Take 1 capsule (20 mg total) by mouth every morning. Patient not taking: Reported on 11/24/2018 11/23/16 12/21/18  Wray Heady, MD      Allergies    Patient has no known allergies.    Review of Systems   Review of Systems  Constitutional:  Negative for fever.  Gastrointestinal:  Positive for abdominal pain. Negative for vomiting.  All other systems reviewed and are negative.   Physical Exam Updated Vital Signs BP 131/89   Pulse 70   Temp 98.2 F (36.8 C)   Resp 14   SpO2 99%  Physical Exam Vitals and nursing note reviewed.  Constitutional:      General: She is not in acute distress.    Appearance: Normal appearance. She is well-developed.  HENT:     Head: Normocephalic and atraumatic.     Nose: Nose normal.  Eyes:     Pupils: Pupils are equal, round, and reactive to light.  Cardiovascular:     Rate and Rhythm: Normal rate and regular rhythm.     Pulses: Normal pulses.     Heart sounds: Normal heart sounds.  Pulmonary:     Effort: Pulmonary  effort is normal. No respiratory distress.     Breath sounds: Normal breath sounds.  Abdominal:     General: Bowel sounds are normal. There is no distension.     Palpations: Abdomen is soft.     Tenderness: There is no abdominal tenderness. There is no guarding or rebound.  Musculoskeletal:        General: Normal range of motion.     Cervical back: Neck supple.  Skin:    General: Skin is dry.     Capillary Refill: Capillary refill takes less than 2 seconds.     Findings: No erythema or rash.  Neurological:     General: No focal deficit present.     Mental Status: She is alert.     Deep Tendon Reflexes: Reflexes normal.  Psychiatric:         Mood and Affect: Mood normal.     ED Results / Procedures / Treatments   Labs (all labs ordered are listed, but only abnormal results are displayed) Results for orders placed or performed during the hospital encounter of 07/13/23  Lipase, blood   Collection Time: 07/12/23  9:11 PM  Result Value Ref Range   Lipase 55 (H) 11 - 51 U/L  Comprehensive metabolic panel   Collection Time: 07/12/23  9:11 PM  Result Value Ref Range   Sodium 139 135 - 145 mmol/L   Potassium 4.2 3.5 - 5.1 mmol/L   Chloride 104 98 - 111 mmol/L   CO2 21 (L) 22 - 32 mmol/L   Glucose, Bld 124 (H) 70 - 99 mg/dL   BUN 10 6 - 20 mg/dL   Creatinine, Ser 4.09 0.44 - 1.00 mg/dL   Calcium 9.5 8.9 - 81.1 mg/dL   Total Protein 7.8 6.5 - 8.1 g/dL   Albumin 4.5 3.5 - 5.0 g/dL   AST 22 15 - 41 U/L   ALT 13 0 - 44 U/L   Alkaline Phosphatase 62 38 - 126 U/L   Total Bilirubin 0.3 0.0 - 1.2 mg/dL   GFR, Estimated >91 >47 mL/min   Anion gap 14 5 - 15  CBC   Collection Time: 07/12/23  9:11 PM  Result Value Ref Range   WBC 7.4 4.0 - 10.5 K/uL   RBC 4.14 3.87 - 5.11 MIL/uL   Hemoglobin 12.2 12.0 - 15.0 g/dL   HCT 82.9 56.2 - 13.0 %   MCV 91.1 80.0 - 100.0 fL   MCH 29.5 26.0 - 34.0 pg   MCHC 32.4 30.0 - 36.0 g/dL   RDW 86.5 78.4 - 69.6 %   Platelets 225 150 - 400 K/uL   nRBC 0.0 0.0 - 0.2 %  hCG, quantitative, pregnancy   Collection Time: 07/12/23  9:11 PM  Result Value Ref Range   hCG, Beta Chain, Quant, S <1 <5 mIU/mL   No results found.   EKG EKG Interpretation Date/Time:  Tuesday Blayne Garlick 22 2025 21:05:11 EDT Ventricular Rate:  100 PR Interval:  138 QRS Duration:  104 QT Interval:  350 QTC Calculation: 452 R Axis:   65  Text Interpretation: Sinus tachycardia Confirmed by Mikalah Skyles ([id number]) on 07/13/2023 1:28:41 AM  Radiology No results found.  Procedures Procedures    Medications Ordered in ED Medications  fentaNYL  (SUBLIMAZE ) injection 50 mcg (50 mcg Intravenous Given 07/13/23 0155)   droperidol  (INAPSINE ) 2.5 MG/ML injection 1.25 mg (1.25 mg Intravenous Given 07/13/23 0151)  ketorolac  (TORADOL ) 30 MG/ML injection 15 mg (15 mg Intravenous Given 07/13/23 0251)  ED Course/ Medical Decision Making/ A&P                                 Medical Decision Making Patient with recurrent abdominal pain   Amount and/or Complexity of Data Reviewed External Data Reviewed: notes.    Details: Previous notes reviewed  Labs: ordered.    Details: Lipase slight elevation 55, normal sodium 139, normal potassium 4.2, normal creatinine.  Normal white count 7.4, normal hemoglobin 12.2, normal platelets.  Negative pregnancy test   Risk Prescription drug management. Risk Details: Sleeping post medication.  Well appearing.  Normal exam.  Discharged with pain medication.  Follow up with your GI specialist.  Stable for discharge.      Final Clinical Impression(s) / ED Diagnoses Final diagnoses:  Chronic pancreatitis, unspecified pancreatitis type (HCC)   No signs of systemic illness or infection. The patient is nontoxic-appearing on exam and vital signs are within normal limits.  I have reviewed the triage vital signs and the nursing notes. Pertinent labs & imaging results that were available during my care of the patient were reviewed by me and considered in my medical decision making (see chart for details). After history, exam, and medical workup I feel the patient has been appropriately medically screened and is safe for discharge home. Pertinent diagnoses were discussed with the patient. Patient was given return precautions.    Rx / DC Orders ED Discharge Orders          Ordered    traMADol  (ULTRAM ) 50 MG tablet  Every 6 hours PRN        07/13/23 0328              Mikhai Bienvenue, MD 07/13/23 1610

## 2023-07-13 NOTE — ED Notes (Signed)
 Pt states she has chronic pancreatitis and has had a recent flare-up States when she got home from work last night began having pain and nausea and "felt like her heart was racing"

## 2023-07-13 NOTE — ED Notes (Signed)
 Lab called to add a quant to previous blood work

## 2023-10-06 ENCOUNTER — Other Ambulatory Visit: Payer: Self-pay

## 2023-10-06 ENCOUNTER — Emergency Department (HOSPITAL_BASED_OUTPATIENT_CLINIC_OR_DEPARTMENT_OTHER)

## 2023-10-06 ENCOUNTER — Emergency Department (HOSPITAL_BASED_OUTPATIENT_CLINIC_OR_DEPARTMENT_OTHER)
Admission: EM | Admit: 2023-10-06 | Discharge: 2023-10-07 | Disposition: A | Discharge status: Home / Self Care | Attending: Emergency Medicine | Admitting: Emergency Medicine

## 2023-10-06 ENCOUNTER — Encounter (HOSPITAL_BASED_OUTPATIENT_CLINIC_OR_DEPARTMENT_OTHER): Payer: Self-pay | Admitting: Emergency Medicine

## 2023-10-06 DIAGNOSIS — R1084 Generalized abdominal pain: Secondary | ICD-10-CM

## 2023-10-06 DIAGNOSIS — K859 Acute pancreatitis without necrosis or infection, unspecified: Secondary | ICD-10-CM | POA: Diagnosis not present

## 2023-10-06 DIAGNOSIS — R109 Unspecified abdominal pain: Secondary | ICD-10-CM | POA: Diagnosis present

## 2023-10-06 LAB — CBC
HCT: 35.3 % — ABNORMAL LOW (ref 36.0–46.0)
Hemoglobin: 11.5 g/dL — ABNORMAL LOW (ref 12.0–15.0)
MCH: 29.3 pg (ref 26.0–34.0)
MCHC: 32.6 g/dL (ref 30.0–36.0)
MCV: 90.1 fL (ref 80.0–100.0)
Platelets: 237 K/uL (ref 150–400)
RBC: 3.92 MIL/uL (ref 3.87–5.11)
RDW: 12.2 % (ref 11.5–15.5)
WBC: 17.4 K/uL — ABNORMAL HIGH (ref 4.0–10.5)
nRBC: 0 % (ref 0.0–0.2)

## 2023-10-06 LAB — HEPATIC FUNCTION PANEL
ALT: 17 U/L (ref 0–44)
AST: 19 U/L (ref 15–41)
Albumin: 4.3 g/dL (ref 3.5–5.0)
Alkaline Phosphatase: 65 U/L (ref 38–126)
Bilirubin, Direct: 0.2 mg/dL (ref 0.0–0.2)
Indirect Bilirubin: 0.2 mg/dL — ABNORMAL LOW (ref 0.3–0.9)
Total Bilirubin: 0.4 mg/dL (ref 0.0–1.2)
Total Protein: 7.4 g/dL (ref 6.5–8.1)

## 2023-10-06 LAB — BASIC METABOLIC PANEL WITH GFR
Anion gap: 11 (ref 5–15)
BUN: 13 mg/dL (ref 6–20)
CO2: 24 mmol/L (ref 22–32)
Calcium: 9.3 mg/dL (ref 8.9–10.3)
Chloride: 103 mmol/L (ref 98–111)
Creatinine, Ser: 0.97 mg/dL (ref 0.44–1.00)
GFR, Estimated: >60 mL/min (ref >60)
Glucose, Bld: 91 mg/dL (ref 70–99)
Potassium: 4.2 mmol/L (ref 3.5–5.1)
Sodium: 138 mmol/L (ref 135–145)

## 2023-10-06 LAB — TROPONIN T, HIGH SENSITIVITY: Troponin T High Sensitivity: <15 ng/L (ref <19)

## 2023-10-06 LAB — LIPASE, BLOOD: Lipase: 1454 U/L — ABNORMAL HIGH (ref 11–51)

## 2023-10-06 MED ORDER — LACTATED RINGERS IV SOLN: INTRAVENOUS | Status: DC

## 2023-10-06 MED ORDER — LACTATED RINGERS IV BOLUS
1000.0000 mL | Freq: Once | INTRAVENOUS | Status: AC
  Administered 2023-10-06: 1000 mL via INTRAVENOUS

## 2023-10-06 MED ORDER — FENTANYL CITRATE PF 50 MCG/ML IJ SOSY
100.0000 ug | PREFILLED_SYRINGE | Freq: Once | INTRAMUSCULAR | Status: AC
  Administered 2023-10-07: 100 ug via INTRAVENOUS
  Filled 2023-10-06: qty 2

## 2023-10-06 MED ORDER — ONDANSETRON HCL 4 MG/2ML IJ SOLN
4.0000 mg | Freq: Once | INTRAMUSCULAR | Status: AC
  Administered 2023-10-06: 4 mg via INTRAVENOUS
  Filled 2023-10-06: qty 2

## 2023-10-06 MED ORDER — KETOROLAC TROMETHAMINE 30 MG/ML IJ SOLN
15.0000 mg | Freq: Once | INTRAMUSCULAR | Status: AC
  Administered 2023-10-06: 15 mg via INTRAVENOUS
  Filled 2023-10-06: qty 1

## 2023-10-06 NOTE — ED Provider Notes (Signed)
 Care of patient received from prior provider at 3:34 AM, please see their note for complete H/P and care plan.  Received handoff per ED course.  Clinical Course as of 10/07/23 0334  Thu Oct 06, 2023  2255 Stable HO WP 70 YOF with a chief complaint of aoc AP Chronic pancreatitis. S/P Chole. Failing her OP medications.  IV meds and rehydration Pending LFTs [CC]  2348 Had some pain after PE on Monday.   [CC]    Clinical Course User Index [CC] Jerral Meth, MD    Reassessment: Patient with severe pancreatitis.  Patient is very motivated for outpatient management.  We discussed keeping her for admission and observation but she feels like she can manage her symptoms as she has expansive history of this disease. Treated with pain medications nausea medicine and IV fluid and patient grossly symptomatically resolved after 8 hours in the emergency room.  Shared medical decision making regarding neck steps.  Again recommended observation admission patient declined stating that she would rather follow-up with her normal doctor in the outpatient setting.  Medications for supportive care sent to pharmacy for best optimization of outpatient success but patient was again reminded of risk of progression of disease in the outpatient setting and importance of returning if she has acute worsening and patient expressed understanding. Disposition:  Patient is requesting discharge at this time.  Given patient's understanding of risk of severe missed diagnosis based on limitations of today's evaluation and risk of interval worsening of disease including life or limb threatening pathology, will participate in shared medical decision making and patient directed discharge at this time.  Patient is welcome to return for further diagnostic evaluation/therapeutic management at any time.      Jerral Meth, MD 10/07/23 [phone]

## 2023-10-06 NOTE — ED Provider Notes (Signed)
 [place] [facility] Provider Note   CSN: [id number] Arrival date & time: 10/06/23  8091     Patient presents with: Chest Pain and Abdominal Pain   Shannon Holloway is a 28 y.o. female.   Patient is a 28 year old female with a history of ADHD, chronic pancreatitis from pancreatic divisum, status postcholecystectomy who is presenting today with complaints of abdominal pain and nausea.  She reports symptoms started this afternoon and have progressively worsened.  She has had significant nausea but first episode of emesis was after arrival here.  She has not had any fever or diarrhea.  Denies any urinary symptoms.  She has not had any alcohol and has started no new medications.  She did take some Tylenol  at home but it did not help.  She states this feels like all of her other episodes.  After the pain had been ongoing for a while she also reported it shot up into her chest and back.  The history is provided by the patient.  Chest Pain Associated symptoms: abdominal pain   Abdominal Pain Associated symptoms: chest pain        Prior to Admission medications   Medication Sig Start Date End Date Taking? Authorizing Provider  amoxicillin -clavulanate (AUGMENTIN ) 875-125 MG tablet Take 1 tablet by mouth every 12 (twelve) hours. 10/12/22   Ragan, Michael, FNP  benzonatate  (TESSALON ) 100 MG capsule Take 1 capsule (100 mg total) by mouth every 8 (eight) hours. 05/10/23   Jerrol Agent, MD  ketorolac  (TORADOL ) 10 MG tablet Take 1 tablet (10 mg total) by mouth every 6 (six) hours as needed. 05/10/23   Jerrol Agent, MD  norelgestromin-ethinyl estradiol (XULANE) 150-35 MCG/24HR transdermal patch Place 1 patch onto the skin once a week.    [provider]  ondansetron  (ZOFRAN -ODT) 4 MG disintegrating tablet Take 1 tablet (4 mg total) by mouth every 8 (eight) hours as needed for nausea or vomiting. 09/08/22   Small, Brooke L, PA  traMADol  (ULTRAM ) 50 MG  tablet Take 1 tablet (50 mg total) by mouth every 6 (six) hours as needed for severe pain (pain score 7-10). 07/13/23   Palumbo, April, MD  buPROPion  (WELLBUTRIN  XL) 150 MG 24 hr tablet Take 1 tablet (150 mg total) by mouth daily. Patient not taking: Reported on 12/21/2018 02/24/17 12/21/18  Geralene Kaiser, MD  VYVANSE  20 MG capsule Take 1 capsule (20 mg total) by mouth every morning. Patient not taking: Reported on 11/24/2018 11/23/16 12/21/18  Geralene Kaiser, MD    Allergies: Patient has no known allergies.    Review of Systems  Cardiovascular:  Positive for chest pain.  Gastrointestinal:  Positive for abdominal pain.    Updated Vital Signs BP (!) 154/108 (BP Location: Right Arm)   Pulse 69   Temp 99.1 F (37.3 C)   Resp 16   Ht 5' 7 (1.702 m)   Wt 79.4 kg   LMP 10/02/2023 (Exact Date)   SpO2 100%   BMI 27.41 kg/m   Physical Exam Vitals and nursing note reviewed.  Constitutional:      General: She is not in acute distress.    Appearance: She is well-developed.  HENT:     Head: Normocephalic and atraumatic.     Mouth/Throat:     Mouth: Mucous membranes are dry.  Eyes:     Pupils: Pupils are equal, round, and reactive to light.  Cardiovascular:     Rate and Rhythm: Normal rate and regular rhythm.  Pulses: Normal pulses.     Heart sounds: Normal heart sounds. No murmur heard.    No friction rub.  Pulmonary:     Effort: Pulmonary effort is normal.     Breath sounds: Normal breath sounds. No wheezing or rales.  Abdominal:     General: Bowel sounds are normal. There is no distension.     Palpations: Abdomen is soft.     Tenderness: There is abdominal tenderness in the epigastric area and periumbilical area. There is no guarding or rebound.  Musculoskeletal:        General: No tenderness. Normal range of motion.     Right lower leg: No edema.     Left lower leg: No edema.     Comments: No edema  Skin:    General: Skin is warm and dry.     Findings: No rash.   Neurological:     Mental Status: She is alert and oriented to person, place, and time. Mental status is at baseline.     Cranial Nerves: No cranial nerve deficit.  Psychiatric:        Behavior: Behavior normal.     (all labs ordered are listed, but only abnormal results are displayed) Labs Reviewed  CBC - Abnormal; Notable for the following components:      Result Value   WBC 17.4 (*)    Hemoglobin 11.5 (*)    HCT 35.3 (*)    All other components within normal limits  BASIC METABOLIC PANEL WITH GFR  PREGNANCY, URINE  HEPATIC FUNCTION PANEL  LIPASE, BLOOD  TROPONIN T, HIGH SENSITIVITY    EKG: None  Radiology: DG Chest 2 View Result Date: 10/06/2023 CLINICAL DATA:  Chest pain and history of pancreatitis EXAM: CHEST - 2 VIEW COMPARISON:  05/10/2023 FINDINGS: The heart size and mediastinal contours are within normal limits. Both lungs are clear. The visualized skeletal structures are unremarkable. IMPRESSION: No active cardiopulmonary disease. Electronically Signed   By: Oneil Devonshire M.D.   On: 10/06/2023 19:56     Procedures   Medications Ordered in the ED  ondansetron  (ZOFRAN ) injection 4 mg (has no administration in time range)  ketorolac  (TORADOL ) 30 MG/ML injection 15 mg (has no administration in time range)  lactated ringers  bolus 1,000 mL (has no administration in time range)  lactated ringers  infusion (has no administration in time range)    Clinical Course as of 10/06/23 2311  Thu Oct 06, 2023  2255 Stable HO WP 28 YOF with a chief complaint of aoc AP Chronic pancreatitis. S/P Chole. Failing her OP medications.  IV meds and rehydration Pending LFTs [CC]    Clinical Course User Index [CC] Jerral Meth, MD                                 Medical Decision Making Amount and/or Complexity of Data Reviewed Labs: ordered. Radiology: ordered.  Risk Prescription drug management.   Pt with multiple medical problems and comorbidities and presenting  today with a complaint that caries a high risk for morbidity and mortality.  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  Low suspicion for hepatitis.  Patient is status postcholecystectomy had low suspicion for duct obstruction.  She is not taking any medications that would flare her pancreas and does not use any alcohol.  She denies any infectious symptoms.  She does report the pain also radiates into  her chest which she has had happen in the past as well.  Low suspicion for PE, cardiac etiology.  I independently interpreted and reviewed patient's labs and EKG.  EKG is within normal limits without acute findings.  BS P is within normal limits, CBC with leukocytosis of 17 with stable hemoglobin of 11.5.  Troponin is within normal limits.  LFTs and lipase are pending.  I have independently visualized and interpreted pt's images today.  Chest x-ray within normal limits.       Final diagnoses:  None    ED Discharge Orders     None          Doretha Folks, MD 10/06/23 2311

## 2023-10-06 NOTE — ED Triage Notes (Signed)
 Pt POV reports having chronic pancreatitis, having a flare-up x 6 hours.    Taken ibuprofen  800 mg, famotidine  20 mg appx 1300 today.   C/o nausea, denies emesis. C/o L chest pain that radiates to R chest and down to abd.

## 2023-10-07 LAB — PREGNANCY, URINE: Preg Test, Ur: NEGATIVE

## 2023-10-07 MED ORDER — ONDANSETRON HCL 4 MG/2ML IJ SOLN
4.0000 mg | Freq: Once | INTRAMUSCULAR | Status: AC
  Administered 2023-10-07: 4 mg via INTRAVENOUS
  Filled 2023-10-07: qty 2

## 2023-10-07 MED ORDER — ONDANSETRON HCL 4 MG PO TABS: 4.0000 mg | ORAL_TABLET | Freq: Four times a day (QID) | ORAL | 0 refills | Status: AC | PRN

## 2023-10-07 MED ORDER — OXYCODONE HCL 5 MG PO TABS: 5.0000 mg | ORAL_TABLET | Freq: Four times a day (QID) | ORAL | 0 refills | Status: AC | PRN
